# Patient Record
Sex: Male | Born: 1975 | Race: White | Hispanic: No | Marital: Married | Smoking: Current every day smoker
Health system: Southern US, Community
[De-identification: ages and names within clinical notes are randomized; demographics above are authoritative.]

## PROBLEM LIST (undated history)

## (undated) DIAGNOSIS — F329 Major depressive disorder, single episode, unspecified: Secondary | ICD-10-CM

## (undated) DIAGNOSIS — G4700 Insomnia, unspecified: Secondary | ICD-10-CM

## (undated) DIAGNOSIS — F419 Anxiety disorder, unspecified: Secondary | ICD-10-CM

## (undated) DIAGNOSIS — F410 Panic disorder [episodic paroxysmal anxiety] without agoraphobia: Secondary | ICD-10-CM

## (undated) DIAGNOSIS — F32A Depression, unspecified: Secondary | ICD-10-CM

## (undated) HISTORY — DX: Anxiety disorder, unspecified: F41.9

## (undated) HISTORY — PX: HAND SURGERY: SHX662

## (undated) HISTORY — DX: Panic disorder (episodic paroxysmal anxiety): F41.0

## (undated) HISTORY — DX: Insomnia, unspecified: G47.00

---

## 1999-08-21 ENCOUNTER — Observation Stay (HOSPITAL_COMMUNITY): Admission: RE | Admit: 1999-08-21 | Discharge: 1999-08-22 | Payer: Self-pay | Admitting: Orthopedic Surgery

## 1999-08-21 ENCOUNTER — Encounter: Payer: Self-pay | Admitting: Orthopedic Surgery

## 2003-05-25 ENCOUNTER — Emergency Department (HOSPITAL_COMMUNITY): Admission: EM | Admit: 2003-05-25 | Discharge: 2003-05-25 | Payer: Self-pay | Admitting: Emergency Medicine

## 2003-05-25 ENCOUNTER — Encounter: Payer: Self-pay | Admitting: Emergency Medicine

## 2003-09-16 ENCOUNTER — Emergency Department (HOSPITAL_COMMUNITY): Admission: EM | Admit: 2003-09-16 | Discharge: 2003-09-17 | Payer: Self-pay | Admitting: Emergency Medicine

## 2003-09-18 ENCOUNTER — Ambulatory Visit (HOSPITAL_COMMUNITY): Admission: RE | Admit: 2003-09-18 | Discharge: 2003-09-18 | Payer: Self-pay | Admitting: Family Medicine

## 2003-10-11 ENCOUNTER — Emergency Department (HOSPITAL_COMMUNITY): Admission: EM | Admit: 2003-10-11 | Discharge: 2003-10-11 | Payer: Self-pay | Admitting: Emergency Medicine

## 2004-01-05 ENCOUNTER — Emergency Department (HOSPITAL_COMMUNITY): Admission: EM | Admit: 2004-01-05 | Discharge: 2004-01-05 | Payer: Self-pay | Admitting: Emergency Medicine

## 2004-01-07 ENCOUNTER — Emergency Department (HOSPITAL_COMMUNITY): Admission: EM | Admit: 2004-01-07 | Discharge: 2004-01-08 | Payer: Self-pay | Admitting: Emergency Medicine

## 2004-01-10 ENCOUNTER — Ambulatory Visit (HOSPITAL_COMMUNITY): Admission: RE | Admit: 2004-01-10 | Discharge: 2004-01-10 | Payer: Self-pay | Admitting: General Surgery
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
## 2005-03-17 ENCOUNTER — Emergency Department (HOSPITAL_COMMUNITY): Admission: EM | Admit: 2005-03-17 | Discharge: 2005-03-18 | Payer: Self-pay | Admitting: Emergency Medicine

## 2005-09-16 ENCOUNTER — Emergency Department (HOSPITAL_COMMUNITY): Admission: EM | Admit: 2005-09-16 | Discharge: 2005-09-16 | Payer: Self-pay | Admitting: Emergency Medicine

## 2005-09-17 ENCOUNTER — Emergency Department (HOSPITAL_COMMUNITY): Admission: EM | Admit: 2005-09-17 | Discharge: 2005-09-18 | Payer: Self-pay | Admitting: *Deleted

## 2005-09-21 ENCOUNTER — Ambulatory Visit: Payer: Self-pay | Admitting: Orthopedic Surgery

## 2007-02-03 ENCOUNTER — Emergency Department (HOSPITAL_COMMUNITY): Admission: EM | Admit: 2007-02-03 | Discharge: 2007-02-04 | Payer: Self-pay | Admitting: Emergency Medicine

## 2007-04-22 ENCOUNTER — Inpatient Hospital Stay (HOSPITAL_COMMUNITY): Admission: AD | Admit: 2007-04-22 | Discharge: 2007-04-27 | Payer: Self-pay | Admitting: Psychiatry

## 2007-04-22 ENCOUNTER — Emergency Department (HOSPITAL_COMMUNITY): Admission: EM | Admit: 2007-04-22 | Discharge: 2007-04-22 | Payer: Self-pay | Admitting: Emergency Medicine

## 2007-04-22 ENCOUNTER — Ambulatory Visit: Payer: Self-pay | Admitting: Psychiatry

## 2007-05-19 ENCOUNTER — Ambulatory Visit (HOSPITAL_COMMUNITY): Payer: Self-pay | Admitting: Psychiatry

## 2009-03-29 ENCOUNTER — Ambulatory Visit (HOSPITAL_COMMUNITY): Admission: RE | Admit: 2009-03-29 | Discharge: 2009-03-29 | Payer: Self-pay | Admitting: Family Medicine

## 2009-06-11 ENCOUNTER — Encounter (INDEPENDENT_AMBULATORY_CARE_PROVIDER_SITE_OTHER): Payer: Self-pay | Admitting: *Deleted

## 2009-08-29 ENCOUNTER — Emergency Department (HOSPITAL_COMMUNITY): Admission: EM | Admit: 2009-08-29 | Discharge: 2009-08-29 | Payer: Self-pay | Admitting: Emergency Medicine

## 2010-02-10 ENCOUNTER — Emergency Department (HOSPITAL_COMMUNITY): Admission: EM | Admit: 2010-02-10 | Discharge: 2010-02-10 | Payer: Self-pay | Admitting: Emergency Medicine

## 2010-05-30 ENCOUNTER — Emergency Department (HOSPITAL_COMMUNITY): Admission: EM | Admit: 2010-05-30 | Discharge: 2010-05-31 | Payer: Self-pay | Admitting: Emergency Medicine

## 2010-08-01 ENCOUNTER — Encounter (HOSPITAL_COMMUNITY)
Admission: RE | Admit: 2010-08-01 | Discharge: 2010-08-31 | Payer: Self-pay | Source: Home / Self Care | Admitting: Orthopaedic Surgery

## 2010-11-15 ENCOUNTER — Encounter: Payer: Self-pay | Admitting: Family Medicine

## 2011-01-28 LAB — URINE MICROSCOPIC-ADD ON

## 2011-01-28 LAB — BASIC METABOLIC PANEL
CO2: 26 mEq/L (ref 19–32)
Calcium: 9.2 mg/dL (ref 8.4–10.5)
Creatinine, Ser: 0.85 mg/dL (ref 0.4–1.5)
GFR calc Af Amer: >60 mL/min (ref >60)
GFR calc non Af Amer: >60 mL/min (ref >60)

## 2011-01-28 LAB — URINALYSIS, ROUTINE W REFLEX MICROSCOPIC
Glucose, UA: NEGATIVE mg/dL
Specific Gravity, Urine: 1.025 (ref 1.005–1.030)

## 2011-01-28 LAB — HEPATIC FUNCTION PANEL
AST: 30 U/L (ref 0–37)
Albumin: 4.3 g/dL (ref 3.5–5.2)
Total Bilirubin: 0.2 mg/dL — ABNORMAL LOW (ref 0.3–1.2)

## 2011-01-28 LAB — DIFFERENTIAL
Basophils Absolute: 0 10*3/uL (ref 0.0–0.1)
Basophils Relative: 0 % (ref 0–1)
Monocytes Relative: 6 % (ref 3–12)
Neutro Abs: 5.2 10*3/uL (ref 1.7–7.7)
Neutrophils Relative %: 49 % (ref 43–77)

## 2011-01-28 LAB — CBC
MCHC: 34 g/dL (ref 30.0–36.0)
RBC: 4.87 MIL/uL (ref 4.22–5.81)
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
## 2011-06-14 ENCOUNTER — Emergency Department (HOSPITAL_COMMUNITY)
Admission: EM | Admit: 2011-06-14 | Discharge: 2011-06-14 | Disposition: A | Discharge status: Home / Self Care | Payer: Medicaid Other | Attending: Emergency Medicine | Admitting: Emergency Medicine

## 2011-06-14 ENCOUNTER — Encounter: Payer: Self-pay | Admitting: *Deleted

## 2011-06-14 DIAGNOSIS — F17200 Nicotine dependence, unspecified, uncomplicated: Secondary | ICD-10-CM | POA: Insufficient documentation

## 2011-06-14 DIAGNOSIS — L509 Urticaria, unspecified: Secondary | ICD-10-CM

## 2011-06-14 MED ORDER — DEXAMETHASONE SODIUM PHOSPHATE 10 MG/ML IJ SOLN
10.0000 mg | Freq: Once | INTRAMUSCULAR | Status: AC
  Administered 2011-06-14: 10 mg via INTRAMUSCULAR
  Filled 2011-06-14: qty 1

## 2011-06-14 MED ORDER — PREDNISONE 20 MG PO TABS: 60.0000 mg | ORAL_TABLET | Freq: Every day | ORAL | Status: AC

## 2011-06-14 MED ORDER — FAMOTIDINE 20 MG PO TABS
20.0000 mg | ORAL_TABLET | Freq: Once | ORAL | Status: AC
  Administered 2011-06-14: 20 mg via ORAL
  Filled 2011-06-14: qty 1

## 2011-06-14 MED ORDER — HYDROXYZINE HCL 25 MG PO TABS
50.0000 mg | ORAL_TABLET | Freq: Once | ORAL | Status: AC
  Administered 2011-06-14: 50 mg via ORAL
  Filled 2011-06-14: qty 2

## 2011-06-14 MED ORDER — DEXAMETHASONE SODIUM PHOSPHATE 10 MG/ML IJ SOLN: 10.0000 mg | Freq: Once | INTRAMUSCULAR | Status: DC

## 2011-06-14 MED ORDER — HYDROXYZINE HCL 50 MG PO TABS: 50.0000 mg | ORAL_TABLET | Freq: Four times a day (QID) | ORAL | Status: AC | PRN

## 2011-06-14 MED ORDER — FAMOTIDINE 20 MG PO TABS: 20.0000 mg | ORAL_TABLET | Freq: Two times a day (BID) | ORAL | Status: DC

## 2011-06-14 NOTE — ED Notes (Signed)
Pt states he ate at a restaurant on Friday and 20 min after finishing he began to itch with hives. Pt skin red over torso and upper arms.

## 2011-06-22 NOTE — ED Provider Notes (Signed)
History     CSN: [id number] Arrival date & time: 06/14/2011 11:30 AM  Chief Complaint  Patient presents with  . Urticaria   Patient is a 35 y.o. male presenting with urticaria. The history is provided by the patient.  Urticaria This is a new problem. The current episode started yesterday (He ate at a local restaurant then developed rash within 30 minutes.  He did not eat any new foods,  no new skin products,  soaps, etc.). The problem occurs constantly. The problem has been waxing and waning. Associated symptoms include a rash. Pertinent negatives include no abdominal pain, arthralgias, chest pain, congestion, coughing, diaphoresis, fever, headaches, joint swelling, nausea, neck pain, numbness, sore throat, swollen glands or weakness. Associated symptoms comments: He denies lip,  Facial or tongue swelling,  No sob. . The symptoms are aggravated by nothing. Treatments tried: benadryl. The treatment provided no relief.  Urticaria This is a new problem. The current episode started yesterday (He ate at a local restaurant then developed rash within 30 minutes.  He did not eat any new foods,  no new skin products,  soaps, etc.). The problem occurs constantly. The problem has been waxing and waning. Pertinent negatives include no chest pain, no abdominal pain, no headaches and no shortness of breath. Associated symptoms comments: He denies lip,  Facial or tongue swelling,  No sob. . The symptoms are aggravated by nothing. Treatments tried: benadryl. The treatment provided no relief.    History reviewed. No pertinent past medical history.  Past Surgical History  Procedure Date  . Hand surgery     History reviewed. No pertinent family history.  History  Substance Use Topics  . Smoking status: Current Everyday Smoker -- 1.0 packs/day  . Smokeless tobacco: Not on file  . Alcohol Use: No      Review of Systems  Constitutional: Negative for fever and diaphoresis.  HENT: Negative for  congestion, sore throat, facial swelling and neck pain.   Eyes: Negative.   Respiratory: Negative for cough, chest tightness and shortness of breath.   Cardiovascular: Negative for chest pain.  Gastrointestinal: Negative for nausea and abdominal pain.  Genitourinary: Negative.   Musculoskeletal: Negative for joint swelling and arthralgias.  Skin: Positive for rash. Negative for wound.  Neurological: Negative for dizziness, weakness, light-headedness, numbness and headaches.  Hematological: Negative.   Psychiatric/Behavioral: Negative.     Physical Exam  BP 120/73  Pulse 102  Temp(Src) 98.7 F (37.1 C) (Oral)  Resp 16  Ht 5\' 11"  (1.803 m)  Wt 201 lb (91.173 kg)  BMI 28.03 kg/m2  SpO2 98%  Physical Exam  Nursing note and vitals reviewed. Constitutional: He is oriented to person, place, and time. He appears well-developed and well-nourished.  HENT:  Head: Normocephalic and atraumatic.  Eyes: Conjunctivae are normal.  Neck: Normal range of motion.  Cardiovascular: Normal rate, regular rhythm, normal heart sounds and intact distal pulses.   Pulmonary/Chest: Effort normal and breath sounds normal. He has no wheezes.  Musculoskeletal: Normal range of motion.  Neurological: He is alert and oriented to person, place, and time.  Skin: Skin is warm and dry. Rash noted. Rash is urticarial.       Urticarial rash noted on chest,  Abdomen,  Upper extremities.  Psychiatric: He has a normal mood and affect.    ED Course  Procedures Patient given decadron 10 mg IM.  Prednisone,  famotidine and atarax for sx relief. MDM Urticarial rash of unclear etiology.  Candis Musa, PA 06/22/11 0116  Medical screening examination/treatment/procedure(s) were performed by non-physician practitioner and as supervising physician I was immediately available for consultation/collaboration.  Ethelda Chick, MD 07/14/11 [phone]

## 2011-08-12 LAB — CBC
HCT: 41.6
Hemoglobin: 14.5
Platelets: 233
WBC: 12.3 — ABNORMAL HIGH

## 2011-08-12 LAB — BASIC METABOLIC PANEL
BUN: 7
GFR calc non Af Amer: >60
Potassium: 3.8
Sodium: 138

## 2011-08-12 LAB — DIFFERENTIAL
Eosinophils Relative: 4
Lymphocytes Relative: 39
Lymphs Abs: 4.8 — ABNORMAL HIGH
Neutro Abs: 5.9

## 2011-08-12 LAB — ACETAMINOPHEN LEVEL: Acetaminophen (Tylenol), Serum: 12

## 2011-08-12 LAB — ETHANOL: Alcohol, Ethyl (B): <5

## 2012-01-23 ENCOUNTER — Encounter (HOSPITAL_COMMUNITY): Payer: Self-pay | Admitting: *Deleted

## 2012-01-23 ENCOUNTER — Emergency Department (HOSPITAL_COMMUNITY)
Admission: EM | Admit: 2012-01-23 | Discharge: 2012-01-24 | Disposition: A | Discharge status: Other Acute Inpatient Hospital | Payer: Medicaid Other | Attending: Emergency Medicine | Admitting: Emergency Medicine

## 2012-01-23 DIAGNOSIS — R45850 Homicidal ideations: Secondary | ICD-10-CM | POA: Insufficient documentation

## 2012-01-23 DIAGNOSIS — F3289 Other specified depressive episodes: Secondary | ICD-10-CM | POA: Insufficient documentation

## 2012-01-23 DIAGNOSIS — F329 Major depressive disorder, single episode, unspecified: Secondary | ICD-10-CM | POA: Insufficient documentation

## 2012-01-23 DIAGNOSIS — R45851 Suicidal ideations: Secondary | ICD-10-CM

## 2012-01-23 DIAGNOSIS — F1410 Cocaine abuse, uncomplicated: Secondary | ICD-10-CM | POA: Insufficient documentation

## 2012-01-23 DIAGNOSIS — F17200 Nicotine dependence, unspecified, uncomplicated: Secondary | ICD-10-CM | POA: Insufficient documentation

## 2012-01-23 HISTORY — DX: Depression, unspecified: F32.A

## 2012-01-23 HISTORY — DX: Major depressive disorder, single episode, unspecified: F32.9

## 2012-01-23 LAB — COMPREHENSIVE METABOLIC PANEL
Albumin: 4.2 g/dL (ref 3.5–5.2)
Alkaline Phosphatase: 108 U/L (ref 39–117)
BUN: 8 mg/dL (ref 6–23)
CO2: 28 mEq/L (ref 19–32)
Chloride: 100 mEq/L (ref 96–112)
Creatinine, Ser: 0.96 mg/dL (ref 0.50–1.35)
GFR calc Af Amer: >90 mL/min (ref >90)
GFR calc non Af Amer: >90 mL/min (ref >90)
Glucose, Bld: 103 mg/dL — ABNORMAL HIGH (ref 70–99)
Total Bilirubin: 0.9 mg/dL (ref 0.3–1.2)

## 2012-01-23 LAB — CBC
HCT: 44.8 % (ref 39.0–52.0)
Hemoglobin: 15.4 g/dL (ref 13.0–17.0)
MCV: 87.3 fL (ref 78.0–100.0)
RDW: 12.2 % (ref 11.5–15.5)
WBC: 9.7 10*3/uL (ref 4.0–10.5)

## 2012-01-23 LAB — ETHANOL: Alcohol, Ethyl (B): <11 mg/dL (ref 0–11)

## 2012-01-23 LAB — ACETAMINOPHEN LEVEL: Acetaminophen (Tylenol), Serum: <15 ug/mL (ref 10–30)

## 2012-01-23 LAB — RAPID URINE DRUG SCREEN, HOSP PERFORMED: Barbiturates: NOT DETECTED

## 2012-01-23 NOTE — ED Provider Notes (Signed)
History   This chart was scribed for Darrell Guppy, MD by Charolett Bumpers . The patient was seen in room APA16A/APA16A and the patient's care was started at 3:14pm.    CSN: [id number]  Arrival date & time 01/23/12  1436   First MD Initiated Contact with Patient 01/23/12 1511      Chief Complaint  Patient presents with  . V70.1    (Consider location/radiation/quality/duration/timing/severity/associated sxs/prior treatment) HPI [name] is a 36 y.o. male who presents to the Emergency Department for medical clearance with IVC papers taken out by the patient's wife. Per IVC papers, patient has been threatening others as well as himself for the past couple of days, and using crack-cocaine. Patient has a h/o depression in which he takes Prozac for. Per papers, patient has been off of his medication. Per patient's report, he denies suicidal and homicidal ideations. Patient denies any h/o psychiatric problems. Patient denies any recent drug use and states that the last time he used crack-cocaine was "a long time ago." Patient denies any alcohol use. Patient states that the only medications he takes are Prozac and Hydrocodone. Patient denies any recent illness. No other pertinent medical hx reported. No associated symptoms reported. Officer is at bedside.  Past Medical History  Diagnosis Date  . Depression     Past Surgical History  Procedure Date  . Hand surgery     History reviewed. No pertinent family history.  History  Substance Use Topics  . Smoking status: Current Everyday Smoker -- 1.0 packs/day    Types: Cigarettes  . Smokeless tobacco: Not on file  . Alcohol Use: Yes     occ. use      Review of Systems A complete 10 system review of systems was obtained and all systems are negative except as noted in the HPI and PMH.   Allergies  Cephalexin; Keflex; Levaquin; Penicillins; and Propoxacet-n  Home Medications   Current Outpatient Rx  Name Route Sig  Dispense Refill  . FLUOXETINE HCL 20 MG PO CAPS Oral Take 20 mg by mouth daily.    Marland Kitchen HYDROCODONE-ACETAMINOPHEN 7.5-325 MG PO TABS Oral Take 1 tablet by mouth every 4 (four) hours as needed. pain      BP 115/68  Pulse 98  Temp(Src) 97.8 F (36.6 C) (Oral)  Resp 18  Ht 5\' 10"  (1.778 m)  Wt 195 lb (88.451 kg)  BMI 27.98 kg/m2  SpO2 96%  Physical Exam  Nursing note and vitals reviewed. Constitutional: He is oriented to person, place, and time. He appears well-developed and well-nourished. No distress.  HENT:  Head: Normocephalic and atraumatic.  Right Ear: External ear normal.  Left Ear: External ear normal.  Nose: Nose normal.  Eyes: EOM are normal. Pupils are equal, round, and reactive to light.  Neck: Normal range of motion. Neck supple. No tracheal deviation present.  Cardiovascular: Normal rate, regular rhythm and normal heart sounds.  Exam reveals no gallop and no friction rub.   No murmur heard. Pulmonary/Chest: Effort normal and breath sounds normal. No respiratory distress. He has no wheezes. He has no rales.  Abdominal: Soft. Bowel sounds are normal. He exhibits no distension. There is no tenderness.  Musculoskeletal: Normal range of motion. He exhibits no edema.  Neurological: He is alert and oriented to person, place, and time. No sensory deficit.  Skin: Skin is warm and dry.  Psychiatric: He has a normal mood and affect. His behavior is normal. Judgment and thought content normal.  ED Course  Procedures (including critical care time) Alleged suicidal and homicidal thoughts.  The patient.  Denies suicidal or homicidal thoughts.  At this time.  He is not psychotic and he is not impaired.  He is reticent in his speech, and I'm not sure whether he is being truthful or not.  With the intent of being released to carry out.  His suicidal or homicidal plans.  The IVC papers completed by his wife state that.  He did complete a note stating that he wasn't contemplating suicide  and that his family would be better off without him  DIAGNOSTIC STUDIES: Oxygen Saturation is 96% on room air, adequate by my interpretation.    COORDINATION OF CARE:  1523: Discussed planned course of care with the patient who was agreeable at this time.  1639: Consult to call ACT team.  1747: Consultation with Behavioral Health who will assess the patient.    Results for orders placed during the hospital encounter of 01/23/12  CBC      Component Value Range   WBC 9.7  4.0 - 10.5 (K/uL)   RBC 5.13  4.22 - 5.81 (MIL/uL)   Hemoglobin 15.4  13.0 - 17.0 (g/dL)   HCT 11.9  14.7 - 82.9 (%)   MCV 87.3  78.0 - 100.0 (fL)   MCH 30.0  26.0 - 34.0 (pg)   MCHC 34.4  30.0 - 36.0 (g/dL)   RDW 56.2  13.0 - 86.5 (%)   Platelets 196  150 - 400 (K/uL)  COMPREHENSIVE METABOLIC PANEL      Component Value Range   Sodium 137  135 - 145 (mEq/L)   Potassium 4.0  3.5 - 5.1 (mEq/L)   Chloride 100  96 - 112 (mEq/L)   CO2 28  19 - 32 (mEq/L)   Glucose, Bld 103 (*) 70 - 99 (mg/dL)   BUN 8  6 - 23 (mg/dL)   Creatinine, Ser 7.84  0.50 - 1.35 (mg/dL)   Calcium 9.7  8.4 - 69.6 (mg/dL)   Total Protein 7.5  6.0 - 8.3 (g/dL)   Albumin 4.2  3.5 - 5.2 (g/dL)   AST 53 (*) 0 - 37 (U/L)   ALT 66 (*) 0 - 53 (U/L)   Alkaline Phosphatase 108  39 - 117 (U/L)   Total Bilirubin 0.9  0.3 - 1.2 (mg/dL)   GFR calc non Af Amer >90  >90 (mL/min)   GFR calc Af Amer >90  >90 (mL/min)  ETHANOL      Component Value Range   Alcohol, Ethyl (B) <11  0 - 11 (mg/dL)  ACETAMINOPHEN LEVEL      Component Value Range   Acetaminophen (Tylenol), Serum <15.0  10 - 30 (ug/mL)  URINE RAPID DRUG SCREEN (HOSP PERFORMED)      Component Value Range   Opiates NONE DETECTED  NONE DETECTED    Cocaine POSITIVE (*) NONE DETECTED    Benzodiazepines POSITIVE (*) NONE DETECTED    Amphetamines NONE DETECTED  NONE DETECTED    Tetrahydrocannabinol NONE DETECTED  NONE DETECTED    Barbiturates NONE DETECTED  NONE DETECTED      No diagnosis  found.    MDM  Alleged SI/HI with hx of substance abuse and depression    I personally performed the services described in this documentation, which was scribed in my presence. The recorded information has been reviewed and considered.      Darrell Guppy, MD 01/25/12 [phone]

## 2012-01-23 NOTE — ED Notes (Signed)
Gave patient crackers and peanut butter and ice water as requested.

## 2012-01-23 NOTE — ED Notes (Signed)
Pt states he doesn't want any visitors from his family to be allowed to visit.

## 2012-01-23 NOTE — ED Notes (Signed)
Act team at bedside 
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
## 2012-01-23 NOTE — ED Notes (Signed)
Patient lying in bed sleeping. Rise and fall of chest noted. Sitter at bedside.

## 2012-01-23 NOTE — ED Notes (Signed)
Pt brought here for involunatry commitment, denies any SI or HI, when asked pt smiled and denies HI, per papers pt using crack and threatening others and to self, denies any alcohol or drug use

## 2012-01-24 NOTE — ED Notes (Signed)
Called [facility] for transportation to [facility]

## 2012-01-24 NOTE — ED Notes (Signed)
Breakfast tray ordered 

## 2012-01-24 NOTE — ED Notes (Signed)
Pt sleeping, sitter in hall

## 2012-01-24 NOTE — ED Notes (Signed)
Amy Sandiford-[phone] please call upon pt transfer.

## 2012-01-24 NOTE — ED Notes (Signed)
Reported that pt's wife has pt's belongings

## 2012-01-24 NOTE — ED Notes (Signed)
Pt still sleeping...

## 2012-01-24 NOTE — ED Notes (Signed)
Breakfast tray given. °

## 2012-01-24 NOTE — BH Assessment (Signed)
Assessment Note   [name] is an 36 y.o. male. Patient was reassessed on this date. He continues to deny all of the previous issues discussed in prior assessment on 01/23/12; however, he is starting to make statements that he did think about what was discussed yesterday and he will use this opportunity to make some positive changes and change his life, hopefully for the better. He stated that his parents had all kinds of issues when he was younger, and related that back to him and his other siblings and the complications they have experienced. His children were here earlier-very nice children, well behaved. Discussed with him the importance of them seeing that he has to get better and to change so they do not have the same circumstances or issues as he and his siblings. He did agree that he really wanted to make things better, especially for them.  Talked with Dannielle Huh at [facility]; patient was accepted by Dr. Jeannine Kitten. [name] requested that we tell patient he would not receive narcotics while there. Patient verbalized his understanding; he hasn't requested or received any narcotics while here at [facility]; patient was agreeable. Support paperwork was completed; IVC paperwork updated and given to [facility] department.  Axis I: Substance Abuse-Cocaine Dependence; Major Depression, Recurrent Axis II: Deferred Axis II Hand Surgery Axis IV: Relationship issues, financial issues, job loss Axis V GAF 28  Past Medical History:  Past Medical History  Diagnosis Date  . Depression     Past Surgical History  Procedure Date  . Hand surgery     Family History: History reviewed. No pertinent family history.  Social History:  reports that he has been smoking Cigarettes.  He has been smoking about 1 pack per day. He does not have any smokeless tobacco history on file. He reports that he drinks alcohol. He reports that he does not use illicit drugs.  Additional Social History:      Allergies:  Allergies  Allergen Reactions  . Cephalexin   . Keflex Hives  . Levaquin Hives  . Penicillins Hives  . Propoxacet-N     Home Medications:  No current facility-administered medications on file as of 01/23/2012.   No current outpatient prescriptions on file as of 01/23/2012.    OB/GYN Status:  No LMP for male patient.  General Assessment Data Location of Assessment: [facility] ACT Assessment: Yes Living Arrangements: Family members Can pt return to current living arrangement?: Yes Admission Status: Involuntary Is patient capable of signing voluntary admission?: No Transfer from: Acute Hospital Referral Source: MD  Education Status Is patient currently in school?: No Contact person: Amy Hileman  Risk to self Suicidal Ideation: No (Denies currently; left a suicide note per IVC) Suicidal Intent: No-Not Currently/Within Last 6 Months Is patient at risk for suicide?: Yes Suicidal Plan?: Yes-Currently Present (per family; said he would burn trailer down and die) Specify Current Suicidal Plan: burn trailer down and kill himself  Access to Means: Yes Specify Access to Suicidal Means: fire What has been your use of drugs/alcohol within the last 12 months?: Denies-UDS is positive for cocaine and benzo's Previous Attempts/Gestures: No How many times?: 0  Other Self Harm Risks: unknown Triggers for Past Attempts: Unknown Intentional Self Injurious Behavior: None Family Suicide History: Unknown (grandfather was at [facility] for etoh and depression) Recent stressful life event(s): Conflict (Comment);Financial Problems;Job Loss (lost job over one year ago) Persecutory voices/beliefs?: No Depression: Yes Depression Symptoms: Isolating;Loss of interest in usual pleasures;Feeling angry/irritable  Substance abuse history and/or treatment for substance abuse?: Yes Suicide prevention information given to non-admitted patients: Not applicable  Risk to Others Homicidal Ideation: No-Not  Currently/Within Last 6 Months (pt denies, but family states he threatened kill father, brot) Thoughts of Harm to Others: No-Not Currently Present/Within Last 6 Months Current Homicidal Intent: No-Not Currently/Within Last 6 Months Current Homicidal Plan: No-Not Currently/Within Last 6 Months (unknown) Describe Current Homicidal Plan: unknow-was just a statemetn Access to Homicidal Means: No Identified Victim: father, step-brother History of harm to others?: No Assessment of Violence: None Noted Violent Behavior Description: none known Does patient have access to weapons?: Yes (Comment) (pawned father's gun) Criminal Charges Pending?: No Does patient have a court date: No  Psychosis Hallucinations: None noted Delusions: None noted  Mental Status Report Appear/Hygiene: Disheveled Eye Contact: Poor Motor Activity: Agitation;Restlessness;Rigidity Speech: Logical/coherent Level of Consciousness: Alert Mood: Anxious;Ambivalent Affect: Anxious;Blunted;Depressed Anxiety Level: Minimal Thought Processes: Relevant Judgement: Impaired Orientation: Person;Place;Time;Situation Obsessive Compulsive Thoughts/Behaviors: Minimal  Cognitive Functioning Concentration: Decreased Memory: Recent Intact;Remote Intact IQ: Average Insight: Poor Impulse Control: Poor Appetite: Fair Sleep:  (unknown) Vegetative Symptoms: None  Prior Inpatient Therapy Prior Inpatient Therapy: Yes Prior Therapy Dates: 2008 Prior Therapy Facilty/Provider(s): [facility] Reason for Treatment: substance abuse, depression  Prior Outpatient Therapy Prior Outpatient Therapy: No            Values / Beliefs Cultural Requests During Hospitalization: None Spiritual Requests During Hospitalization: None        Additional Information 1:1 In Past 12 Months?: No CIRT Risk: No Elopement Risk: Yes Does patient have medical clearance?: Yes     Disposition:  Disposition Disposition of Patient:  Inpatient treatment program Type of inpatient treatment program: Adult  On Site Evaluation by:  Dr. Deretha Emory Reviewed with Physician:  Dr. Deretha Emory  Patient accepted to [facility] by Dr. Jeannine Kitten; per Dannielle Huh. Transfer paperwork completed. Report transmitted through RN.   Shon Baton H 01/24/2012 11:24 AM

## 2012-01-24 NOTE — BH Assessment (Deleted)
Assessment Note   [name] is an 36 y.o. male. Patient was reassessed on this date. He continues to deny all of the previous issues discussed in prior assessment on 01/23/12; however, he is starting to make statements that he did think about what was discussed yesterday and he will use this opportunity to make some positive changes and change his life, hopefully for the better. He stated that his parents had all kinds of issues when he was younger, and related that back to him and his other siblings and the complications they have experienced. His children were here earlier-very nice children, well behaved. Discussed with him the importance of them seeing that he has to get better and to change so they do not have the same circumstances or issues as he and his siblings. He did agree that he really wanted to make things better, especially for them.  Talked with [name] at [facility]; patient was accepted by [name]. [name] requested that we tell patient he would not receive narcotics while there. Patient verbalized his understanding; he hasn't requested or received any narcotics while here at [facility]; patient was agreeable. Support paperwork was completed; IVC paperwork updated and given to [name] department.  Axis I: Substance Abuse-Cocaine Dependence; Major Depression, Recurrent Axis II: Deferred Axis II Hand Surgery Axis IV: Relationship issues, financial issues, job loss Axis V GAF 28  Past Medical History:  Past Medical History  Diagnosis Date  . Depression     Past Surgical History  Procedure Date  . Hand surgery     Family History: History reviewed. No pertinent family history.  Social History:  reports that he has been smoking Cigarettes.  He has been smoking about 1 pack per day. He does not have any smokeless tobacco history on file. He reports that he drinks alcohol. He reports that he does not use illicit drugs.  Additional Social History:      Allergies:  Allergies  Allergen Reactions  . Cephalexin   . Keflex Hives  . Levaquin Hives  . Penicillins Hives  . Propoxacet-N     Home Medications:  No current facility-administered medications on file as of 01/23/2012.   No current outpatient prescriptions on file as of 01/23/2012.    OB/GYN Status:  No LMP for male patient.  General Assessment Data Location of Assessment: [facility] ACT Assessment: Yes Living Arrangements: Family members Can pt return to current living arrangement?: Yes Admission Status: Involuntary Is patient capable of signing voluntary admission?: No Transfer from: Acute Hospital Referral Source: MD  Education Status Is patient currently in school?: No Contact person: [name]  Risk to self Suicidal Ideation: No (Denies currently; left a suicide note per IVC) Suicidal Intent: No-Not Currently/Within Last 6 Months Is patient at risk for suicide?: Yes Suicidal Plan?: Yes-Currently Present (per family; said he would burn trailer down and die) Specify Current Suicidal Plan: burn trailer down and kill himself  Access to Means: Yes Specify Access to Suicidal Means: fire What has been your use of drugs/alcohol within the last 12 months?: Denies-UDS is positive for cocaine and benzo's Previous Attempts/Gestures: No How many times?: 0  Other Self Harm Risks: unknown Triggers for Past Attempts: Unknown Intentional Self Injurious Behavior: None Family Suicide History: Unknown (grandfather was at [facility] for etoh and depression) Recent stressful life event(s): Conflict (Comment);Financial Problems;Job Loss (lost job over one year ago) Persecutory voices/beliefs?: No Depression: Yes Depression Symptoms: Isolating;Loss of interest in usual pleasures;Feeling angry/irritable  Substance abuse history and/or treatment for substance abuse?: Yes Suicide prevention information given to non-admitted patients: Not applicable  Risk to Others Homicidal Ideation: No-Not  Currently/Within Last 6 Months (pt denies, but family states he threatened kill father, brot) Thoughts of Harm to Others: No-Not Currently Present/Within Last 6 Months Current Homicidal Intent: No-Not Currently/Within Last 6 Months Current Homicidal Plan: No-Not Currently/Within Last 6 Months (unknown) Describe Current Homicidal Plan: unknow-was just a statemetn Access to Homicidal Means: No Identified Victim: father, step-brother History of harm to others?: No Assessment of Violence: None Noted Violent Behavior Description: none known Does patient have access to weapons?: Yes (Comment) (pawned father's gun) Criminal Charges Pending?: No Does patient have a court date: No  Psychosis Hallucinations: None noted Delusions: None noted  Mental Status Report Appear/Hygiene: Disheveled Eye Contact: Poor Motor Activity: Agitation;Restlessness;Rigidity Speech: Logical/coherent Level of Consciousness: Alert Mood: Anxious;Ambivalent Affect: Anxious;Blunted;Depressed Anxiety Level: Minimal Thought Processes: Relevant Judgement: Impaired Orientation: Person;Place;Time;Situation Obsessive Compulsive Thoughts/Behaviors: Minimal  Cognitive Functioning Concentration: Decreased Memory: Recent Intact;Remote Intact IQ: Average Insight: Poor Impulse Control: Poor Appetite: Fair Sleep:  (unknown) Vegetative Symptoms: None  Prior Inpatient Therapy Prior Inpatient Therapy: Yes Prior Therapy Dates: 2008 Prior Therapy Facilty/Provider(s): [facility] Reason for Treatment: substance abuse, depression  Prior Outpatient Therapy Prior Outpatient Therapy: No            Values / Beliefs Cultural Requests During Hospitalization: None Spiritual Requests During Hospitalization: None        Additional Information 1:1 In Past 12 Months?: No CIRT Risk: No Elopement Risk: Yes Does patient have medical clearance?: Yes     Disposition:  Disposition Disposition of Patient:  Inpatient treatment program Type of inpatient treatment program: Adult  On Site Evaluation by:  Dr. Deretha Emory Reviewed with Physician:  Dr. Deretha Emory  Patient accepted to [facility] by [name]; per [name]. Transfer paperwork completed. Report transmitted through RN.   Shon Baton H 01/24/2012 11:36 AM

## 2012-01-24 NOTE — ED Notes (Signed)
Pt sleeping comfortably with equal chest rise and fall.  nad noted.  Sitter at bedside.

## 2012-01-24 NOTE — ED Notes (Signed)
Pt has been accepted at behavioral health but has no bed assignment. Behavioral health will call when ready for pt.

## 2014-04-24 ENCOUNTER — Other Ambulatory Visit (HOSPITAL_COMMUNITY): Payer: Self-pay | Admitting: Orthopaedic Surgery

## 2014-04-24 DIAGNOSIS — M25511 Pain in right shoulder: Secondary | ICD-10-CM

## 2014-04-30 ENCOUNTER — Other Ambulatory Visit (HOSPITAL_COMMUNITY): Payer: Self-pay | Admitting: Orthopaedic Surgery

## 2014-04-30 ENCOUNTER — Ambulatory Visit (HOSPITAL_COMMUNITY)
Admission: RE | Admit: 2014-04-30 | Discharge: 2014-04-30 | Disposition: A | Discharge status: Home / Self Care | Payer: BC Managed Care – PPO | Source: Ambulatory Visit | Attending: Orthopaedic Surgery | Admitting: Orthopaedic Surgery

## 2014-04-30 DIAGNOSIS — M25511 Pain in right shoulder: Secondary | ICD-10-CM

## 2014-04-30 DIAGNOSIS — M719 Bursopathy, unspecified: Secondary | ICD-10-CM | POA: Insufficient documentation

## 2014-04-30 DIAGNOSIS — M25512 Pain in left shoulder: Secondary | ICD-10-CM

## 2014-04-30 DIAGNOSIS — M67919 Unspecified disorder of synovium and tendon, unspecified shoulder: Secondary | ICD-10-CM | POA: Insufficient documentation

## 2014-04-30 DIAGNOSIS — M25519 Pain in unspecified shoulder: Secondary | ICD-10-CM | POA: Insufficient documentation

## 2014-05-31 ENCOUNTER — Encounter: Payer: Self-pay | Admitting: Family Medicine

## 2014-05-31 ENCOUNTER — Ambulatory Visit (INDEPENDENT_AMBULATORY_CARE_PROVIDER_SITE_OTHER): Payer: BC Managed Care – PPO | Admitting: Family Medicine

## 2014-05-31 VITALS — BP 132/78 | Ht 71.0 in | Wt 189.0 lb

## 2014-05-31 DIAGNOSIS — F329 Major depressive disorder, single episode, unspecified: Secondary | ICD-10-CM

## 2014-05-31 DIAGNOSIS — F32A Depression, unspecified: Secondary | ICD-10-CM

## 2014-05-31 DIAGNOSIS — F3289 Other specified depressive episodes: Secondary | ICD-10-CM

## 2014-05-31 DIAGNOSIS — G4700 Insomnia, unspecified: Secondary | ICD-10-CM

## 2014-05-31 MED ORDER — FLUOXETINE HCL 40 MG PO CAPS: 40.0000 mg | ORAL_CAPSULE | Freq: Every day | ORAL | Status: DC

## 2014-05-31 NOTE — Progress Notes (Signed)
   Subjective:    Patient ID: [name], male    DOB: 07-14-1976, 38 y.o.   MRN: [id number]  HPIMed check up. Pt taking prozac 20mg . Would like to discuss increasing to 40mg .  Patient access to 40 mg Prozac. He took this for while. Reports considerable improvement in his mood.  Ongoing challenges with insomnia. This improves when he is on the medication.  Exercising some at work.  No suicidal or homicidal thoughts. Periods of feeling down on current 20 mg dose.   Works hard  Review of Systems No headache no chest pain no back pain no abdominal pain ROS otherwise negative    Objective:   Physical Exam  Alert no apparent distress. HEENT normal. Lungs clear. Heart regular in rhythm. Ankles without edema.     Assessment & Plan:  Impression 1 insomnia ongoing improvement with good treatment #2 #2 depression plan diet exercise discussed. Increase Prozac to 40. Check every 6 months. Warning signs discussed. WSL

## 2014-06-03 DIAGNOSIS — F329 Major depressive disorder, single episode, unspecified: Secondary | ICD-10-CM | POA: Insufficient documentation

## 2014-06-03 DIAGNOSIS — F32A Depression, unspecified: Secondary | ICD-10-CM | POA: Insufficient documentation

## 2014-06-03 DIAGNOSIS — G4700 Insomnia, unspecified: Secondary | ICD-10-CM | POA: Insufficient documentation

## 2014-07-09 ENCOUNTER — Ambulatory Visit (INDEPENDENT_AMBULATORY_CARE_PROVIDER_SITE_OTHER): Payer: BC Managed Care – PPO | Admitting: Family Medicine

## 2014-07-09 ENCOUNTER — Encounter: Payer: Self-pay | Admitting: Family Medicine

## 2014-07-09 VITALS — BP 130/78 | Ht 71.0 in | Wt 197.0 lb

## 2014-07-09 DIAGNOSIS — Z23 Encounter for immunization: Secondary | ICD-10-CM

## 2014-07-09 DIAGNOSIS — Z79899 Other long term (current) drug therapy: Secondary | ICD-10-CM

## 2014-07-09 DIAGNOSIS — Z0000 Encounter for general adult medical examination without abnormal findings: Secondary | ICD-10-CM

## 2014-07-09 MED ORDER — FLUOXETINE HCL 20 MG PO TABS: 20.0000 mg | ORAL_TABLET | Freq: Every day | ORAL | Status: DC

## 2014-07-09 NOTE — Progress Notes (Signed)
   Subjective:    Patient ID: [name], male    DOB: 1975/12/01, 38 y.o.   MRN: [id number]  HPI Patient states he doesn't need any medication refills.   Patient isn't doing any diet or exercise. Patient has no concerns.  Forty mg on the prozac has overcone itnn  Job is physcically active  Gets a lot of exercise at work  Diet overall pretty good  Tries to eat health  No flu shot, will do flu shot today  Smoking less than a pack, started young at [age]  Breathing ok  Rare alcohol intake   Review of Systems  Constitutional: Negative for fever, activity change and appetite change.  HENT: Negative for congestion and rhinorrhea.   Eyes: Negative for discharge.  Respiratory: Negative for cough and wheezing.   Cardiovascular: Negative for chest pain.  Gastrointestinal: Negative for vomiting, abdominal pain and blood in stool.  Genitourinary: Negative for frequency and difficulty urinating.  Musculoskeletal: Negative for neck pain.  Skin: Negative for rash.  Allergic/Immunologic: Negative for environmental allergies and food allergies.  Neurological: Negative for weakness and headaches.  Psychiatric/Behavioral: Negative for agitation.  All other systems reviewed and are negative.      Objective:   Physical Exam  Vitals reviewed. Constitutional: He appears well-developed and well-nourished.  HENT:  Head: Normocephalic and atraumatic.  Right Ear: External ear normal.  Left Ear: External ear normal.  Nose: Nose normal.  Mouth/Throat: Oropharynx is clear and moist.  Eyes: EOM are normal. Pupils are equal, round, and reactive to light.  Neck: Normal range of motion. Neck supple. No thyromegaly present.  Cardiovascular: Normal rate, regular rhythm and normal heart sounds.   No murmur heard. Pulmonary/Chest: Effort normal and breath sounds normal. No respiratory distress. He has no wheezes.  Abdominal: Soft. Bowel sounds are normal. He exhibits no distension and no  mass. There is no tenderness.  Genitourinary: Penis normal.  Musculoskeletal: Normal range of motion. He exhibits no edema.  Lymphadenopathy:    He has no cervical adenopathy.  Neurological: He is alert. He exhibits normal muscle tone.  Skin: Skin is warm and dry. No erythema.  Psychiatric: He has a normal mood and affect. His behavior is normal. Judgment normal.          Assessment & Plan:  Impression 1 this wellness exam #2 chronic smoker discussed #3 depression increased dose of Prozac is worse in the patient's mood. Plan decrease Prozac back to 20. Exercise diet discussed. Appropriate blood work. Quit smoking. WSL

## 2014-07-09 NOTE — Progress Notes (Signed)
   Subjective:    Patient ID: [name], male    DOB: [birth year], 38 y.o.   MRN: [id number]  HPI The patient comes in today for a wellness visit.    A review of their health history was completed.  A review of medications was also comple Review of Systems     Objective:   Physical Exam        Assessment & Plan:

## 2014-07-14 ENCOUNTER — Encounter: Payer: Self-pay | Admitting: *Deleted

## 2017-01-30 ENCOUNTER — Encounter (HOSPITAL_COMMUNITY): Payer: Self-pay | Admitting: *Deleted

## 2017-01-30 ENCOUNTER — Emergency Department (HOSPITAL_COMMUNITY): Payer: 59

## 2017-01-30 ENCOUNTER — Emergency Department (HOSPITAL_COMMUNITY)
Admission: EM | Admit: 2017-01-30 | Discharge: 2017-01-31 | Disposition: A | Discharge status: Home / Self Care | Payer: 59 | Attending: Emergency Medicine | Admitting: Emergency Medicine

## 2017-01-30 DIAGNOSIS — F17210 Nicotine dependence, cigarettes, uncomplicated: Secondary | ICD-10-CM | POA: Insufficient documentation

## 2017-01-30 DIAGNOSIS — K029 Dental caries, unspecified: Secondary | ICD-10-CM | POA: Insufficient documentation

## 2017-01-30 DIAGNOSIS — K047 Periapical abscess without sinus: Secondary | ICD-10-CM | POA: Insufficient documentation

## 2017-01-30 DIAGNOSIS — R220 Localized swelling, mass and lump, head: Secondary | ICD-10-CM

## 2017-01-30 DIAGNOSIS — K0889 Other specified disorders of teeth and supporting structures: Secondary | ICD-10-CM | POA: Diagnosis present

## 2017-01-30 LAB — CBC WITH DIFFERENTIAL/PLATELET
Basophils Absolute: 0.1 10*3/uL (ref 0.0–0.1)
Basophils Relative: 0 %
Eosinophils Absolute: 0.5 10*3/uL (ref 0.0–0.7)
Eosinophils Relative: 3 %
HCT: 44.9 % (ref 39.0–52.0)
HEMOGLOBIN: 15.8 g/dL (ref 13.0–17.0)
LYMPHS ABS: 3.2 10*3/uL (ref 0.7–4.0)
Lymphocytes Relative: 17 %
MCH: 30.7 pg (ref 26.0–34.0)
MCHC: 35.2 g/dL (ref 30.0–36.0)
MCV: 87.4 fL (ref 78.0–100.0)
MONO ABS: 1.7 10*3/uL — AB (ref 0.1–1.0)
MONOS PCT: 9 %
Neutro Abs: 12.8 10*3/uL — ABNORMAL HIGH (ref 1.7–7.7)
Neutrophils Relative %: 71 %
PLATELETS: 258 10*3/uL (ref 150–400)
RBC: 5.14 MIL/uL (ref 4.22–5.81)
RDW: 11.9 % (ref 11.5–15.5)
WBC: 18.2 10*3/uL — ABNORMAL HIGH (ref 4.0–10.5)

## 2017-01-30 LAB — BASIC METABOLIC PANEL
ANION GAP: 7 (ref 5–15)
BUN: 6 mg/dL (ref 6–20)
CALCIUM: 9.5 mg/dL (ref 8.9–10.3)
CO2: 26 mmol/L (ref 22–32)
Chloride: 106 mmol/L (ref 101–111)
Creatinine, Ser: 0.88 mg/dL (ref 0.61–1.24)
GFR calc Af Amer: >60 mL/min (ref >60)
GFR calc non Af Amer: >60 mL/min (ref >60)
Glucose, Bld: 114 mg/dL — ABNORMAL HIGH (ref 65–99)
Potassium: 3.6 mmol/L (ref 3.5–5.1)
Sodium: 139 mmol/L (ref 135–145)

## 2017-01-30 MED ORDER — ONDANSETRON HCL 4 MG/2ML IJ SOLN
4.0000 mg | Freq: Once | INTRAMUSCULAR | Status: AC
  Administered 2017-01-30: 4 mg via INTRAVENOUS
  Filled 2017-01-30: qty 2

## 2017-01-30 MED ORDER — CLINDAMYCIN PHOSPHATE 900 MG/50ML IV SOLN
900.0000 mg | Freq: Once | INTRAVENOUS | Status: AC
  Administered 2017-01-30: 900 mg via INTRAVENOUS
  Filled 2017-01-30: qty 50

## 2017-01-30 MED ORDER — MELOXICAM 15 MG PO TABS: 15.0000 mg | ORAL_TABLET | Freq: Every day | ORAL | 0 refills | Status: DC

## 2017-01-30 MED ORDER — HYDROCODONE-ACETAMINOPHEN 5-325 MG PO TABS: 1.0000 | ORAL_TABLET | ORAL | 0 refills | Status: DC | PRN

## 2017-01-30 MED ORDER — MORPHINE SULFATE (PF) 4 MG/ML IV SOLN
4.0000 mg | Freq: Once | INTRAVENOUS | Status: AC
  Administered 2017-01-30: 4 mg via INTRAVENOUS
  Filled 2017-01-30: qty 1

## 2017-01-30 MED ORDER — IOPAMIDOL (ISOVUE-300) INJECTION 61%
75.0000 mL | Freq: Once | INTRAVENOUS | Status: AC | PRN
  Administered 2017-01-30: 75 mL via INTRAVENOUS

## 2017-01-30 MED ORDER — CLINDAMYCIN HCL 150 MG PO CAPS: ORAL_CAPSULE | ORAL | 0 refills | Status: DC

## 2017-01-30 NOTE — ED Notes (Signed)
Patient transported to CT 

## 2017-01-30 NOTE — ED Notes (Signed)
Patient transported to X-ray 

## 2017-01-30 NOTE — ED Provider Notes (Signed)
[facility] Provider Note   CSN: [id number] Arrival date & time: 01/30/17  2008     History   Chief Complaint Chief Complaint  Patient presents with  . Facial Swelling    HPI [name] is a 41 y.o. male.  Patient is a 41 year old male who presents to the emergency department with a complaint of toothache and right-sided jaw pain. The patient states he's had some swelling and discomfort on the left cheek for about 2 weeks. He has had a broken tooth on the right side for quite some time. He now has swelling of his right jaw, he has ringing in the right ear, and he has a discomfort involving his right neck. He states that at times it feels like it's difficult for him to swallow. He has no problems with his breathing. He is not had any previous operations or procedures involving his face, his jaw, or his throat. He has had a few chills, but has not measured her temperature. He presents now for additional evaluation concerning this issue.   The history is provided by the patient.    Past Medical History:  Diagnosis Date  . Anxiety   . Depression   . Insomnia   . Panic attack     Patient Active Problem List   Diagnosis Date Noted  . Depression 06/03/2014  . Insomnia 06/03/2014    Past Surgical History:  Procedure Laterality Date  . HAND SURGERY Right        Home Medications    Prior to Admission medications   Medication Sig Start Date End Date Taking? Authorizing Provider  FLUoxetine (PROZAC) 20 MG tablet Take 1 tablet (20 mg total) by mouth daily. 07/09/14   Merlyn Albert, MD  HYDROcodone-acetaminophen (NORCO) 7.5-325 MG per tablet Take 1 tablet by mouth every 4 (four) hours as needed. pain    Historical Provider, MD  naproxen (NAPROSYN) 500 MG tablet  05/17/14   Historical Provider, MD    Family History History reviewed. No pertinent family history.  Social History Social History  Substance Use Topics  . Smoking status: Current Every Day Smoker   Packs/day: 1.00    Types: Cigarettes  . Smokeless tobacco: Never Used  . Alcohol use Yes     Comment: occ. use     Allergies   Cephalexin; Cephalexin; Levofloxacin; Penicillins; and Propoxyphene n-acetaminophen   Review of Systems Review of Systems  Constitutional: Positive for appetite change and chills.  HENT: Positive for dental problem.   Eyes: Negative for photophobia, pain, redness and visual disturbance.  Respiratory: Negative for cough, choking, chest tightness, shortness of breath, wheezing and stridor.   Allergic/Immunologic: Negative for immunocompromised state.  All other systems reviewed and are negative.    Physical Exam Updated Vital Signs BP (!) 148/78 (BP Location: Right Arm)   Pulse 61   Temp 99.3 F (37.4 C) (Oral)   Resp 20   Ht  (1.803 m)   Wt 88.5 kg   SpO2 97%   BMI 27.20 kg/m   Physical Exam  Constitutional: He is oriented to person, place, and time. He appears well-developed and well-nourished.  Non-toxic appearance.  HENT:  Head: Normocephalic.  Right Ear: Tympanic membrane and external ear normal.  Left Ear: Tympanic membrane and external ear normal.  There are multiple dental caries on the right and the left. There is swelling of the right lower jaw at the first molar, and also at the right lower premolar area.  There  is swelling of the right lower face. There is tenderness of the mandible up to the TMJ area. There is tenderness at the submandibular area, extending to the upper portion of the right neck.  Eyes: EOM and lids are normal. Pupils are equal, round, and reactive to light.  Neck: Normal range of motion. Neck supple. Carotid bruit is not present.  Cardiovascular: Normal rate, regular rhythm, normal heart sounds, intact distal pulses and normal pulses.  Exam reveals no gallop and no friction rub.   No murmur heard. Pulmonary/Chest: Breath sounds normal. No respiratory distress.  Abdominal: Soft. Bowel sounds are normal. There  is no tenderness. There is no guarding.  Musculoskeletal: Normal range of motion.  Lymphadenopathy:       Head (right side): No submandibular adenopathy present.       Head (left side): No submandibular adenopathy present.    He has no cervical adenopathy.  Neurological: He is alert and oriented to person, place, and time. He has normal strength. No cranial nerve deficit or sensory deficit.  Skin: Skin is warm and dry.  Psychiatric: He has a normal mood and affect. His speech is normal.  Nursing note and vitals reviewed.    ED Treatments / Results  Labs (all labs ordered are listed, but only abnormal results are displayed) Labs Reviewed  CBC WITH DIFFERENTIAL/PLATELET - Abnormal; Notable for the following:       Result Value   WBC 18.2 (*)    Neutro Abs 12.8 (*)    Monocytes Absolute 1.7 (*)    All other components within normal limits  BASIC METABOLIC PANEL - Abnormal; Notable for the following:    Glucose, Bld 114 (*)    All other components within normal limits    EKG  EKG Interpretation None       Radiology Ct Maxillofacial W Contrast  Result Date: 01/30/2017 CLINICAL DATA:  41 y/o M; right lower jaw abscess. Pressure and pain in the right year. EXAM: CT MAXILLOFACIAL WITH CONTRAST TECHNIQUE: Multidetector CT imaging of the maxillofacial structures was performed. Multiplanar CT image reconstructions were also generated. A small metallic BB was placed on the right temple in order to reliably differentiate right from left. COMPARISON:  08/29/2009 CT head CONTRAST:  75 cc Isovue-300 FINDINGS: Osseous: Right mandibular premolar periapical cysts with cortical defect in the outer table of the mandible (series 3, image 74). Additionally there are several dental caries of the maxillary and mandibular teeth and periapical cystic disease of the right first mandibular premolar which is absent the crown. Orbits: Negative. No traumatic or inflammatory finding. Sinuses: Multiple small  mucous retention cysts throughout the maxillary sinuses and right sphenoid sinus. Otherwise visualized paranasal sinuses and mastoid air cells are essentially normally aerated. Soft tissues: Dental abscess associated with the right mandibular premolar periapical disease measuring 32 x 12 x 22 mm (AP x ML x CC series 2: Image 73 and series 4: Image 42). Extensive surrounding soft tissue swelling overlying the right aspect of the mandible, extending into the right cheek subcutaneous fat, and into the right submandibular space. No extension of inflammatory changes are identified in the deep cervical spaces.) Limited intracranial: No significant or unexpected finding. IMPRESSION: Dental abscess superficial to the right mandibular ramus associated with right mandibular premolar periapical disease measuring up to 3.2 cm with extensive surrounding superficial soft tissue inflammatory changes. No extension of inflammatory changes into the deep cervical spaces. Electronically Signed   By: Mitzi Hansen M.D.   On: 01/30/2017  23:39    Procedures Procedures (including critical care time)  Medications Ordered in ED Medications  clindamycin (CLEOCIN) IVPB 900 mg (900 mg Intravenous New Bag/Given 01/30/17 2335)  morphine 4 MG/ML injection 4 mg (4 mg Intravenous Given 01/30/17 2139)  ondansetron (ZOFRAN) injection 4 mg (4 mg Intravenous Given 01/30/17 2138)  iopamidol (ISOVUE-300) 61 % injection 75 mL (75 mLs Intravenous Contrast Given 01/30/17 2301)     Initial Impression / Assessment and Plan / ED Course  I have reviewed the triage vital signs and the nursing notes.  Pertinent labs & imaging results that were available during my care of the patient were reviewed by me and considered in my medical decision making (see chart for details).     **I have reviewed nursing notes, vital signs, and all appropriate lab and imaging results for this patient.*  Final Clinical Impressions(s) / ED  Diagnoses MDM Vital signs monitored closely. Pulse oximetry is 95% on room air. Patient speaks in complete sentences without problem.  Complete blood count shows the white blood cells to be elevated at 18,200 on. The metabolic panel is within normal limits. CT scan of the maxillofacial area shows a dental abscess superficial to the right mandibular ramus associated with right mandibular premolar periapical disease. There is extensive superficial soft tissue inflammation noted. There no inflammatory changes in the deep cervical spaces. There is some inflammation/infection extending in the right cheek subcutaneous fat.  Patient started on intravenous clindamycin. Patient treated for pain. Prescription for clindamycin and Norco given to the patient as well as meloxicam.  I discussed the findings with the patient in detail. I explained to him the importance of seeing a oral surgeon as soon as possible. We discussed the possible dangers of not being evaluated and treated by the dental specialist. The patient acknowledges understanding of these instructions. He is in agreement with this plan.    Final diagnoses:  Dental abscess  Right facial swelling  Dental caries    New Prescriptions New Prescriptions   No medications on file     Ivery Quale, PA-C 01/31/17 1637    Bethann Berkshire, MD 01/31/17 2312

## 2017-01-30 NOTE — ED Triage Notes (Addendum)
Pt reports having a tooth break off on the right lower side of his jaw x 2 weeks and swelling to his right cheek and neck since this morning. Pt reports pressure in his head with ringing in both ears, right ear throbbing, right sided neck pain. Pt states he feels like "something" is draining down into his chest. Pt reports not eating or drinking or eating today due to feeling like he is having "pressure" in his throat and difficulty opening his mouth.

## 2017-01-31 NOTE — Discharge Instructions (Signed)
You have multiple abscess areas of the right jaw. You have soft tissue infection extending into your right face. Your white blood cell count is elevated. It is extremely important that you see an oral surgeon as sone as possible. Please use clindamycin 2 times daily with food. Use meloxicam daily with food. May use Norco for severe pain if needed. This medication may cause drowsiness, please use with caution.

## 2018-03-13 ENCOUNTER — Emergency Department (HOSPITAL_COMMUNITY)
Admission: EM | Admit: 2018-03-13 | Discharge: 2018-03-13 | Disposition: A | Discharge status: Home / Self Care | Payer: No Typology Code available for payment source | Attending: Emergency Medicine | Admitting: Emergency Medicine

## 2018-03-13 ENCOUNTER — Encounter (HOSPITAL_COMMUNITY): Payer: Self-pay | Admitting: Emergency Medicine

## 2018-03-13 ENCOUNTER — Emergency Department (HOSPITAL_COMMUNITY): Payer: No Typology Code available for payment source

## 2018-03-13 DIAGNOSIS — Y929 Unspecified place or not applicable: Secondary | ICD-10-CM | POA: Insufficient documentation

## 2018-03-13 DIAGNOSIS — S161XXA Strain of muscle, fascia and tendon at neck level, initial encounter: Secondary | ICD-10-CM | POA: Insufficient documentation

## 2018-03-13 DIAGNOSIS — S60212A Contusion of left wrist, initial encounter: Secondary | ICD-10-CM | POA: Insufficient documentation

## 2018-03-13 DIAGNOSIS — Y9389 Activity, other specified: Secondary | ICD-10-CM | POA: Insufficient documentation

## 2018-03-13 DIAGNOSIS — Y999 Unspecified external cause status: Secondary | ICD-10-CM | POA: Insufficient documentation

## 2018-03-13 DIAGNOSIS — F17210 Nicotine dependence, cigarettes, uncomplicated: Secondary | ICD-10-CM | POA: Insufficient documentation

## 2018-03-13 MED ORDER — IBUPROFEN 800 MG PO TABS
800.0000 mg | ORAL_TABLET | Freq: Once | ORAL | Status: AC
  Administered 2018-03-13: 800 mg via ORAL
  Filled 2018-03-13: qty 1

## 2018-03-13 MED ORDER — TRAMADOL HCL 50 MG PO TABS: 50.0000 mg | ORAL_TABLET | Freq: Four times a day (QID) | ORAL | 0 refills | Status: DC | PRN

## 2018-03-13 MED ORDER — IBUPROFEN 600 MG PO TABS: 600.0000 mg | ORAL_TABLET | Freq: Four times a day (QID) | ORAL | 0 refills | Status: DC | PRN
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
## 2018-03-13 NOTE — Discharge Instructions (Addendum)
Your x-rays are negative tonight for any kind of bony injuries or dislocation.  You may wear the Velcro wrist splint which may provide pain relief and compression.  Ice and elevation will also help heal your injury.
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
## 2018-10-13 ENCOUNTER — Encounter (HOSPITAL_COMMUNITY): Payer: Self-pay | Admitting: Emergency Medicine

## 2018-10-13 ENCOUNTER — Other Ambulatory Visit: Payer: Self-pay

## 2018-10-13 ENCOUNTER — Emergency Department (HOSPITAL_COMMUNITY)
Admission: EM | Admit: 2018-10-13 | Discharge: 2018-10-13 | Disposition: A | Discharge status: Home / Self Care | Payer: Self-pay | Attending: Emergency Medicine | Admitting: Emergency Medicine

## 2018-10-13 DIAGNOSIS — K047 Periapical abscess without sinus: Secondary | ICD-10-CM | POA: Insufficient documentation

## 2018-10-13 DIAGNOSIS — F17210 Nicotine dependence, cigarettes, uncomplicated: Secondary | ICD-10-CM | POA: Insufficient documentation

## 2018-10-13 MED ORDER — HYDROCODONE-ACETAMINOPHEN 5-325 MG PO TABS: 1.0000 | ORAL_TABLET | Freq: Four times a day (QID) | ORAL | 0 refills | Status: AC | PRN

## 2018-10-13 MED ORDER — CLINDAMYCIN HCL 150 MG PO CAPS: 300.0000 mg | ORAL_CAPSULE | Freq: Three times a day (TID) | ORAL | 0 refills | Status: AC

## 2018-10-13 MED ORDER — HYDROCODONE-ACETAMINOPHEN 5-325 MG PO TABS
1.0000 | ORAL_TABLET | Freq: Once | ORAL | Status: AC
  Administered 2018-10-13: 1 via ORAL
  Filled 2018-10-13: qty 1

## 2018-10-13 MED ORDER — CLINDAMYCIN HCL 150 MG PO CAPS
300.0000 mg | ORAL_CAPSULE | Freq: Once | ORAL | Status: AC
  Administered 2018-10-13: 300 mg via ORAL
  Filled 2018-10-13: qty 2

## 2018-10-13 NOTE — ED Triage Notes (Signed)
Patient complains dental abscess to left upper jaw. Patient complains of pain fever and facial swelling that goes up to his eye. States his throat feels swollen. States nausea, denies vomiting.

## 2018-10-13 NOTE — ED Provider Notes (Signed)
[facility] EMERGENCY DEPARTMENT Provider Note   CSN: [id number] Arrival date & time: 10/13/18  1242     History   Chief Complaint Chief Complaint  Patient presents with  . Dental Pain    HPI Donn PieriniJoey L Koziel is a 42 y.o. male.  HPI Patient presents with facial pain and swelling. Onset was about 12 hours ago. Since that time he has had pain and swelling on the left maxilla. No difficulty breathing, no objective fever Pain is severe, sore, pressure-like. Patient is here with his wife who assists with the HPI They note that the patient has multiple dental issues, has had dental infections in the past.  Past Medical History:  Diagnosis Date  . Anxiety   . Depression   . Insomnia   . Panic attack     Patient Active Problem List   Diagnosis Date Noted  . Depression 06/03/2014  . Insomnia 06/03/2014    Past Surgical History:  Procedure Laterality Date  . HAND SURGERY Right         Home Medications    Prior to Admission medications   Medication Sig Start Date End Date Taking? Authorizing Provider  clindamycin (CLEOCIN) 150 MG capsule Take 2 capsules (300 mg total) by mouth 3 (three) times daily for 7 days. 10/13/18 10/20/18  Gerhard MunchLockwood, Esmeralda Malay, MD  HYDROcodone-acetaminophen (NORCO/VICODIN) 5-325 MG tablet Take 1 tablet by mouth every 6 (six) hours as needed for severe pain. 10/13/18   Gerhard MunchLockwood, Keeton Kassebaum, MD    Family History No family history on file.  Social History Social History   Tobacco Use  . Smoking status: Current Every Day Smoker    Packs/day: 1.00    Types: Cigarettes  . Smokeless tobacco: Never Used  Substance Use Topics  . Alcohol use: Yes    Comment: occ. use  . Drug use: No    Comment: denies use     Allergies   Cephalexin; Levofloxacin; Penicillins; and Propoxyphene n-acetaminophen   Review of Systems Review of Systems  Constitutional:       Per HPI, otherwise negative  HENT:       Per HPI, otherwise negative  Respiratory:   Per HPI, otherwise negative  Cardiovascular:       Per HPI, otherwise negative  Gastrointestinal: Negative for vomiting.  Endocrine:       Negative aside from HPI  Genitourinary:       Neg aside from HPI   Musculoskeletal:       Per HPI, otherwise negative  Skin: Negative.   Neurological: Negative for syncope.     Physical Exam Updated Vital Signs BP 127/86   Pulse (!) 116   Temp (!) 100.6 F (38.1 C) (Oral)   Resp 20   Ht 5\' 11"  (1.803 m)   Wt 86.2 kg   SpO2 95%   BMI 26.50 kg/m   Physical Exam Vitals signs and nursing note reviewed.  Constitutional:      General: He is not in acute distress.    Appearance: He is well-developed.  HENT:     Head: Normocephalic.     Jaw: No trismus or malocclusion.     Salivary Glands: Right salivary gland is not diffusely enlarged or tender. Left salivary gland is not diffusely enlarged or tender.      Mouth/Throat:     Pharynx: Oropharynx is clear. Uvula midline. No pharyngeal swelling, oropharyngeal exudate, posterior oropharyngeal erythema or uvula swelling.   Eyes:     Conjunctiva/sclera: Conjunctivae normal.  Pulmonary:  Effort: Pulmonary effort is normal. No respiratory distress.     Breath sounds: No stridor.  Musculoskeletal:        General: No deformity.  Skin:    General: Skin is warm and dry.  Neurological:     Mental Status: He is alert and oriented to person, place, and time.  Psychiatric:        Mood and Affect: Mood normal.      ED Treatments / Results   Procedures Procedures (including critical care time)  Medications Ordered in ED Medications  clindamycin (CLEOCIN) capsule 300 mg (300 mg Oral Given 10/13/18 1840)  HYDROcodone-acetaminophen (NORCO/VICODIN) 5-325 MG per tablet 1 tablet (1 tablet Oral Given 10/13/18 1840)     Initial Impression / Assessment and Plan / ED Course  I have reviewed the triage vital signs and the nursing notes.  Pertinent labs & imaging results that were available  during my care of the patient were reviewed by me and considered in my medical decision making (see chart for details).  Generally well-appearing male presents with 1 day of facial pain and swelling Patient has borderline fever, is mildly tachycardic, but is not hypotensive, in any distress, has no respiratory compromise, low suspicion for bacteremia, sepsis. Patient has a pair of teeth both broken to gumline on the left axillary ridge, no obvious abscess. Absent distress, patient is a candidate for initial oral therapy, though he and his wife are aware of return precautions, need for dental follow-up closely.   Final Clinical Impressions(s) / ED Diagnoses   Final diagnoses:  Dental infection    ED Discharge Orders         Ordered    clindamycin (CLEOCIN) 150 MG capsule  3 times daily     10/13/18 1828    HYDROcodone-acetaminophen (NORCO/VICODIN) 5-325 MG tablet  Every 6 hours PRN     10/13/18 1828           Gerhard MunchLockwood, Khayla Koppenhaver, MD 10/13/18 [phone]

## 2018-10-13 NOTE — Discharge Instructions (Signed)
As discussed, is important that you schedule follow-up with a dentist for removal of the two problematic teeth on the left upper side of your mouth. Please take all medication as directed, and obtain Peridex, and over-the-counter rinse that will facilitate healing from your pharmacy.  Return here for concerning changes in your condition.

## 2018-11-18 ENCOUNTER — Other Ambulatory Visit: Payer: Self-pay

## 2018-11-18 ENCOUNTER — Emergency Department (HOSPITAL_COMMUNITY): Payer: Self-pay

## 2018-11-18 ENCOUNTER — Emergency Department (HOSPITAL_COMMUNITY)
Admission: EM | Admit: 2018-11-18 | Discharge: 2018-11-18 | Disposition: A | Discharge status: Home / Self Care | Payer: Self-pay | Attending: Emergency Medicine | Admitting: Emergency Medicine

## 2018-11-18 ENCOUNTER — Encounter (HOSPITAL_COMMUNITY): Payer: Self-pay | Admitting: Radiology

## 2018-11-18 DIAGNOSIS — W01198A Fall on same level from slipping, tripping and stumbling with subsequent striking against other object, initial encounter: Secondary | ICD-10-CM | POA: Insufficient documentation

## 2018-11-18 DIAGNOSIS — Y92812 Truck as the place of occurrence of the external cause: Secondary | ICD-10-CM | POA: Insufficient documentation

## 2018-11-18 DIAGNOSIS — Y999 Unspecified external cause status: Secondary | ICD-10-CM | POA: Insufficient documentation

## 2018-11-18 DIAGNOSIS — S20212A Contusion of left front wall of thorax, initial encounter: Secondary | ICD-10-CM | POA: Insufficient documentation

## 2018-11-18 DIAGNOSIS — F17210 Nicotine dependence, cigarettes, uncomplicated: Secondary | ICD-10-CM | POA: Insufficient documentation

## 2018-11-18 DIAGNOSIS — Y9389 Activity, other specified: Secondary | ICD-10-CM | POA: Insufficient documentation

## 2018-11-18 MED ORDER — METHOCARBAMOL 500 MG PO TABS: 500.0000 mg | ORAL_TABLET | Freq: Two times a day (BID) | ORAL | 0 refills | Status: AC

## 2018-11-18 MED ORDER — HYDROCODONE-ACETAMINOPHEN 5-325 MG PO TABS
2.0000 | ORAL_TABLET | Freq: Once | ORAL | Status: AC
  Administered 2018-11-18: 2 via ORAL
  Filled 2018-11-18: qty 2

## 2018-11-18 MED ORDER — DICLOFENAC SODIUM 1 % TD GEL: 2.0000 g | Freq: Four times a day (QID) | TRANSDERMAL | 0 refills | Status: AC

## 2018-11-18 NOTE — ED Provider Notes (Signed)
[facility] EMERGENCY DEPARTMENT Provider Note   CSN: [id number] Arrival date & time: 11/18/18  1919     History   Chief Complaint Chief Complaint  Patient presents with  . rib pain    HPI [name] is a 43 y.o. male with PMH/o anxiety, depression, insomnia who presents for evaluation of left anterior and lateral chest wall pain that began today after mechanical fall.  Patient reports that he was hanging out of the window of his truck trying to reach the ATM.  He reports that his feet slipped which caused him to fall and land on the windowsill of his truck.  He states that since then, he has had pain to the anterior chest lateral chest wall.  He states when he got home, he felt like he was having more trouble taking deep breath secondary to pain.  Patient states he does not take any medications for pain.  He reports he is not having any issues until after the fall.  Patient denies any abdominal pain, vomiting.  The history is provided by the patient.    Past Medical History:  Diagnosis Date  . Anxiety   . Depression   . Insomnia   . Panic attack     Patient Active Problem List   Diagnosis Date Noted  . Depression 06/03/2014  . Insomnia 06/03/2014    Past Surgical History:  Procedure Laterality Date  . HAND SURGERY Right         Home Medications    Prior to Admission medications   Medication Sig Start Date End Date Taking? Authorizing Provider  diclofenac sodium (VOLTAREN) 1 % GEL Apply 2 g topically 4 (four) times daily. 11/18/18   Volanda Napoleon, PA-C  HYDROcodone-acetaminophen (NORCO/VICODIN) 5-325 MG tablet Take 1 tablet by mouth every 6 (six) hours as needed for severe pain. 10/13/18   Carmin Muskrat, MD  methocarbamol (ROBAXIN) 500 MG tablet Take 1 tablet (500 mg total) by mouth 2 (two) times daily. 11/18/18   Volanda Napoleon, PA-C    Family History No family history on file.  Social History Social History   Tobacco Use  . Smoking status:  Current Every Day Smoker    Packs/day: 1.00    Types: Cigarettes  . Smokeless tobacco: Never Used  Substance Use Topics  . Alcohol use: Yes    Comment: occ. use  . Drug use: No    Comment: denies use     Allergies   Cephalexin; Levofloxacin; Penicillins; and Propoxyphene n-acetaminophen   Review of Systems Review of Systems  Respiratory: Positive for shortness of breath.   Gastrointestinal: Negative for abdominal pain, nausea and vomiting.  Musculoskeletal:       Left lateral chest wall pain  All other systems reviewed and are negative.    Physical Exam Updated Vital Signs BP 132/76 (BP Location: Right Arm)   Pulse 87   Temp 98.9 F (37.2 C) (Oral)   Resp 18   Ht 6' (1.829 m)   Wt 86.2 kg   SpO2 94%   BMI 25.77 kg/m   Physical Exam Vitals signs and nursing note reviewed.  Constitutional:      Appearance: He is well-developed.     Comments: Appears uncomfortable but no acute distress.   HENT:     Head: Normocephalic and atraumatic.  Eyes:     General: No scleral icterus.       Right eye: No discharge.        Left eye:  No discharge.     Conjunctiva/sclera: Conjunctivae normal.  Pulmonary:     Effort: Pulmonary effort is normal.     Breath sounds: Normal breath sounds. No decreased breath sounds.     Comments: Lungs clear to auscultation bilaterally.  Symmetric chest rise.  No wheezing, rales, rhonchi.  No evidence of decreased breath sounds.  Patient able to speak in full sentences without any difficulty. Chest:     Chest wall: Tenderness present. No deformity or crepitus.       Comments: Tenderness palpation noted to the anterior chest wall that extends to the lateral chest wall at approximately ribs 7, 8, 9.  No deformity or crepitus noted.  No evidence of flail chest. Abdominal:     General: Abdomen is flat.     Palpations: Abdomen is soft.     Tenderness: There is no abdominal tenderness. There is no guarding or rebound.     Comments: Abdomen is soft,  non-distended, non-tender. No rigidity, No guarding. No peritoneal signs.  Skin:    General: Skin is warm and dry.  Neurological:     Mental Status: He is alert.  Psychiatric:        Speech: Speech normal.        Behavior: Behavior normal.      ED Treatments / Results  Labs (all labs ordered are listed, but only abnormal results are displayed) Labs Reviewed - No data to display  EKG None  Radiology Dg Chest 2 View  Result Date: 11/18/2018 CLINICAL DATA:  [place] with left-sided chest and rib pain. EXAM: CHEST - 2 VIEW COMPARISON:  None. FINDINGS: The heart size and mediastinal contours are within normal limits. Both lungs are clear. The visualized skeletal structures are unremarkable. No sign of rib fracture or pneumothorax. IMPRESSION: No active cardiopulmonary disease. Electronically Signed   By: Nelson Chimes M.D.   On: 11/18/2018 19:46    Procedures Procedures (including critical care time)  Medications Ordered in ED Medications  HYDROcodone-acetaminophen (NORCO/VICODIN) 5-325 MG per tablet 2 tablet (2 tablets Oral Given 11/18/18 2004)     Initial Impression / Assessment and Plan / ED Course  I have reviewed the triage vital signs and the nursing notes.  Pertinent labs & imaging results that were available during my care of the patient were reviewed by me and considered in my medical decision making (see chart for details).     43 year old male who presents for evaluation of left anterior and lateral chest wall pain that began after mechanical fall earlier today.  He reports that he was in his truck and states that he was reaching out of the window to get to the ATM machine when his foot slipped, causing him to fall and hit his chest wall on the windowsill.  Patient reports he has had pain since then.  When he went home, he was having some difficulty breathing, prompting ED visit.  He has not taken medications for the pain.  Patient states that the pain is worse with movement  and palpation and additionally feels like he cannot take a full deep breath in without pain.  Patient reports that his pain began after mechanical fall. Patient is afebrile, non-toxic appearing, sitting comfortably on examination table. Vital signs reviewed and stable.  Patient is not hypoxic or tachycardic.  On exam, he is tenderness palpation noted to anterior and left-sided chest wall particularly at ribs 7, 8, 9.  No deformity or crepitus noted.  No evidence of flail chest.  Lungs are clear to auscultation bilaterally with no evidence of decreased breath sounds.  Consider rib fracture versus contusion.  History/physical exam not concerning for pneumothorax.  Doubt PE given history/physical exam.  History/physical exam not concerning for ACS etiology.  X-ray reviewed.  Negative for any acute fracture dislocation.  Discussed results with patient.  We will plan to treat as rib contusion.  We will plan to give patient an incentive spirometer for prevention of pneumonia.  Encourage at home supportive care measures. Repeat vitals stable without any signs of hypoxia or tachycardia. At this time, patient exhibits no emergent life-threatening condition that require further evaluation in ED or admission. Patient had ample opportunity for questions and discussion. All patient's questions were answered with full understanding. Strict return precautions discussed. Patient expresses understanding and agreement to plan.   Portions of this note were generated with [employer]. Dictation errors may occur despite best attempts at proofreading.  Final Clinical Impressions(s) / ED Diagnoses   Final diagnoses:  Contusion of rib on left side, initial encounter    ED Discharge Orders         Ordered    methocarbamol (ROBAXIN) 500 MG tablet  2 times daily     11/18/18 2018    diclofenac sodium (VOLTAREN) 1 % GEL  4 times daily     11/18/18 2018           Desma Mcgregor 11/18/18 2047      Francine Graven, DO 11/21/18 1356

## 2018-11-18 NOTE — ED Notes (Signed)
From Rad  LL in to assess

## 2018-11-18 NOTE — ED Triage Notes (Signed)
Raining pt slipped and fell across his truck getting in, hurting left ribs and hurt a pop, SOB

## 2018-11-18 NOTE — ED Notes (Signed)
To Rad 

## 2018-11-18 NOTE — ED Notes (Signed)
Pt left cell phone charger in room   Labeled and given to security

## 2018-11-18 NOTE — ED Notes (Signed)
Slipped getting into truck and hit ribs  Heard a pop  Here for evauation

## 2018-11-18 NOTE — Discharge Instructions (Addendum)
You can take Tylenol or Ibuprofen as directed for pain. You can alternate Tylenol and Ibuprofen every 4 hours. If you take Tylenol at 1pm, then you can take Ibuprofen at 5pm. Then you can take Tylenol again at 9pm.   Take Robaxin as prescribed. This medication will make you drowsy so do not drive or drink alcohol when taking it.  Apply Voltaren gel as directed.  As we discussed, it is important that you are taking big full deep breaths to prevent pneumonia.    Follow-up with your primary care doctor as directed.  Return emergency department for any high fever, difficulty breathing, worsening pain, vomiting or any other worsening or concerning symptoms.

## 2019-11-27 ENCOUNTER — Encounter: Payer: Self-pay | Admitting: Family Medicine

## 2020-05-09 IMAGING — DX DG CHEST 2V
2 series · 2 of 2 positions shown · non-contrast
Comparison: None.

CLINICAL DATA: Fell with left-sided chest and rib pain.

EXAM:
CHEST - 2 VIEW

[chest pa]
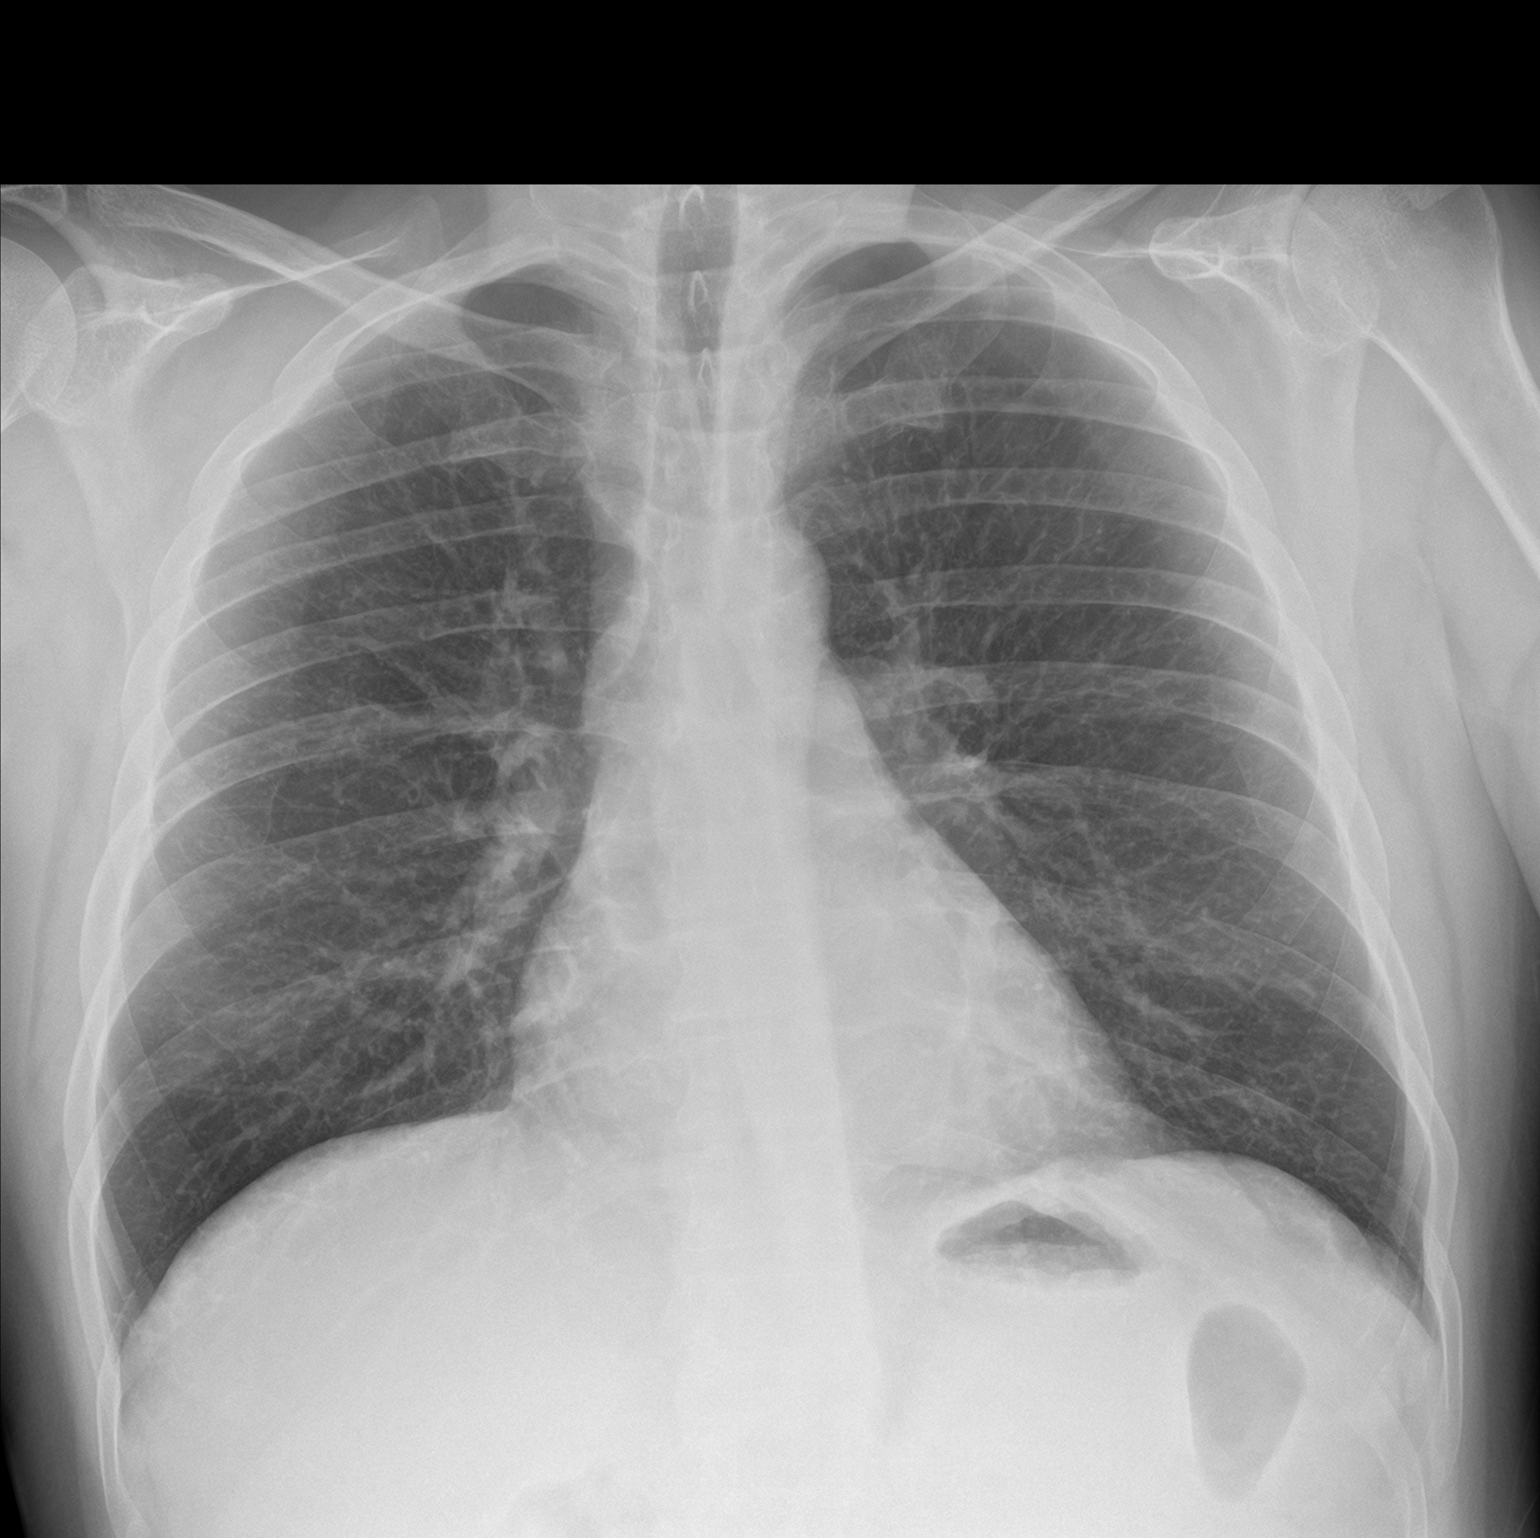

[chest lat]
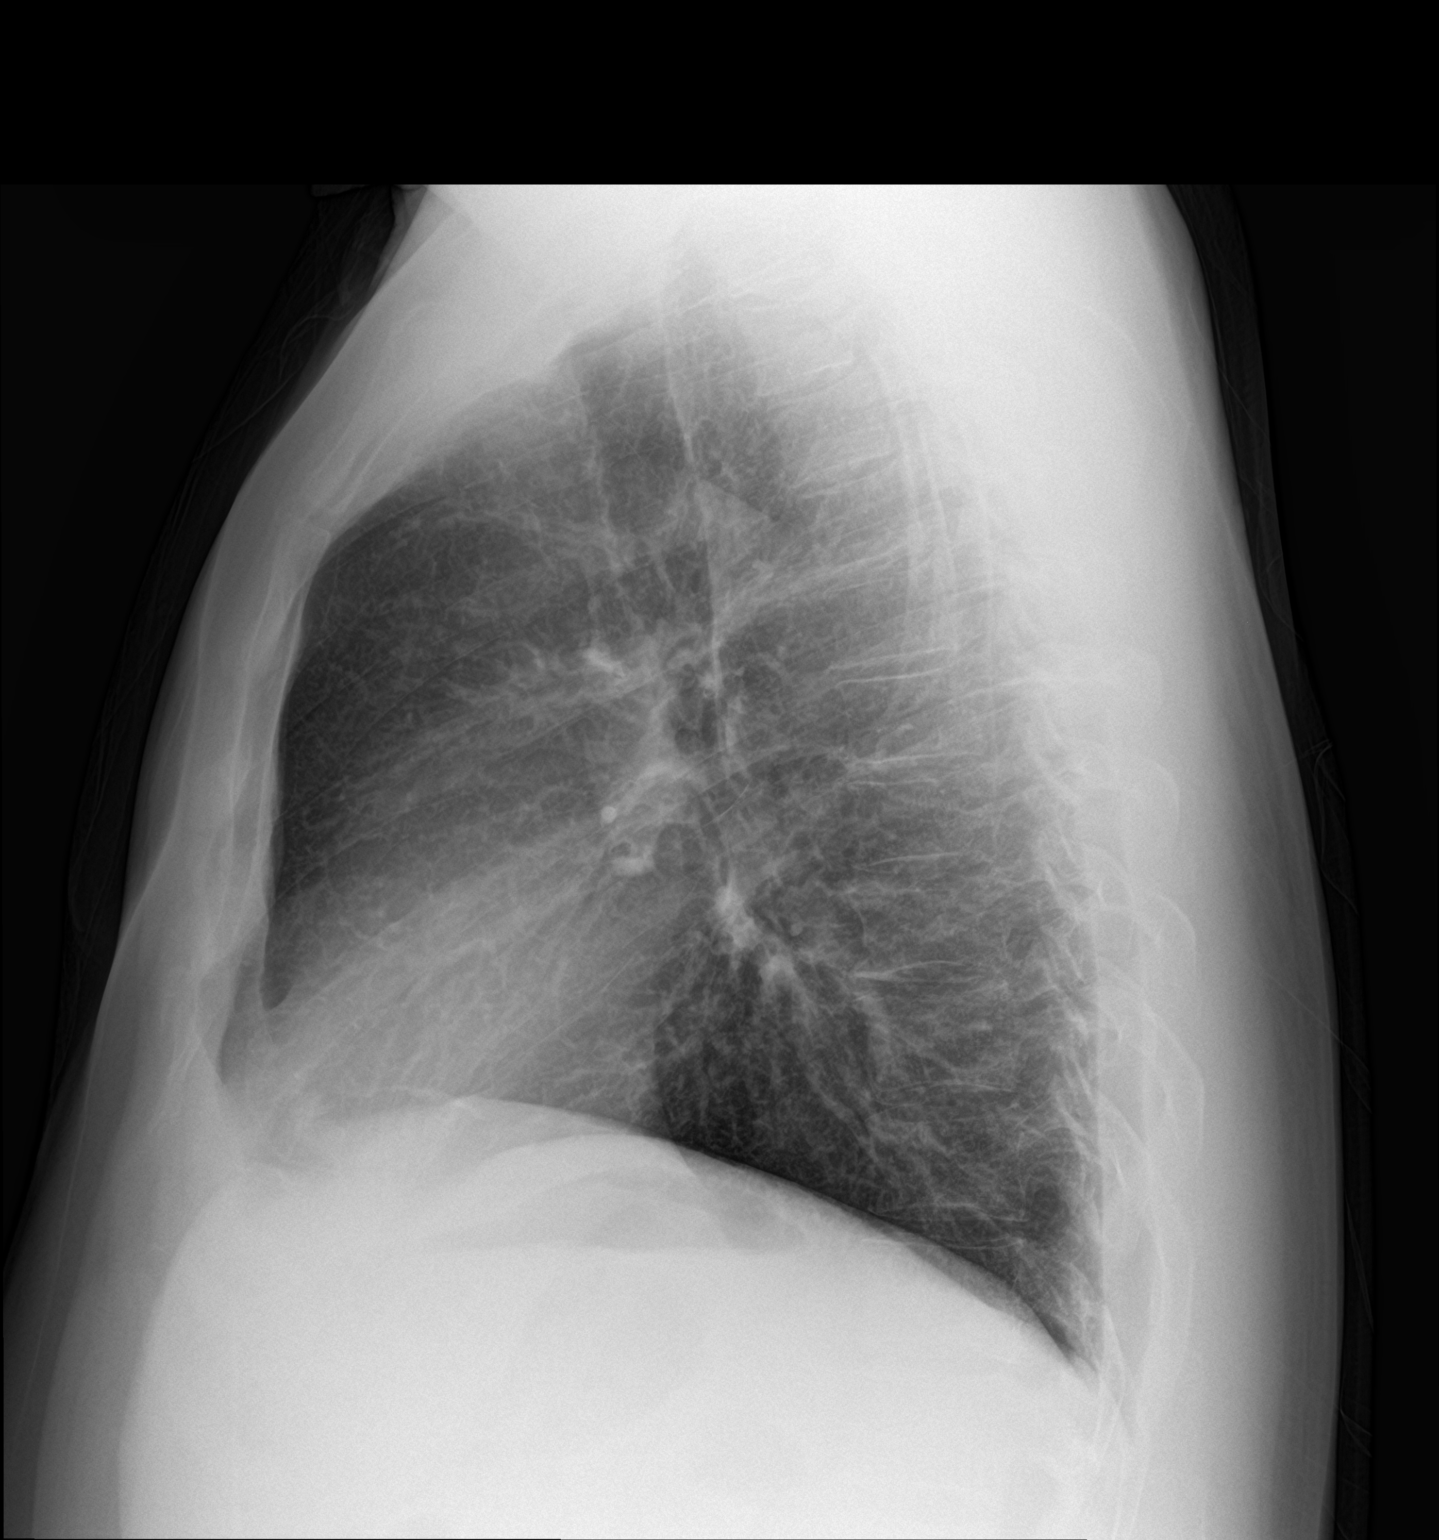

[2 of 2 positions shown; findings below may reference images not displayed]

FINDINGS: The heart size and mediastinal contours are within normal limits.
Both lungs are clear. The visualized skeletal structures are
unremarkable.

No sign of rib fracture or pneumothorax.
IMPRESSION: No active cardiopulmonary disease.

## 2020-07-18 ENCOUNTER — Encounter: Payer: Self-pay | Admitting: Family Medicine

## 2024-06-18 ENCOUNTER — Emergency Department (HOSPITAL_COMMUNITY): Payer: Self-pay

## 2024-06-18 ENCOUNTER — Emergency Department (HOSPITAL_BASED_OUTPATIENT_CLINIC_OR_DEPARTMENT_OTHER): Payer: Self-pay

## 2024-06-18 ENCOUNTER — Encounter (HOSPITAL_BASED_OUTPATIENT_CLINIC_OR_DEPARTMENT_OTHER): Payer: Self-pay

## 2024-06-18 ENCOUNTER — Emergency Department (HOSPITAL_BASED_OUTPATIENT_CLINIC_OR_DEPARTMENT_OTHER): Payer: Self-pay | Admitting: Radiology

## 2024-06-18 ENCOUNTER — Emergency Department (HOSPITAL_BASED_OUTPATIENT_CLINIC_OR_DEPARTMENT_OTHER)
Admission: EM | Admit: 2024-06-18 | Discharge: 2024-06-18 | Disposition: A | Discharge status: Home / Self Care | Payer: Self-pay | Attending: Emergency Medicine | Admitting: Emergency Medicine

## 2024-06-18 ENCOUNTER — Other Ambulatory Visit: Payer: Self-pay

## 2024-06-18 DIAGNOSIS — J439 Emphysema, unspecified: Secondary | ICD-10-CM | POA: Insufficient documentation

## 2024-06-18 DIAGNOSIS — M5432 Sciatica, left side: Secondary | ICD-10-CM

## 2024-06-18 DIAGNOSIS — M5440 Lumbago with sciatica, unspecified side: Secondary | ICD-10-CM

## 2024-06-18 DIAGNOSIS — M5441 Lumbago with sciatica, right side: Secondary | ICD-10-CM | POA: Insufficient documentation

## 2024-06-18 DIAGNOSIS — R930 Abnormal findings on diagnostic imaging of skull and head, not elsewhere classified: Secondary | ICD-10-CM | POA: Insufficient documentation

## 2024-06-18 DIAGNOSIS — M5442 Lumbago with sciatica, left side: Secondary | ICD-10-CM | POA: Insufficient documentation

## 2024-06-18 DIAGNOSIS — Z79899 Other long term (current) drug therapy: Secondary | ICD-10-CM | POA: Insufficient documentation

## 2024-06-18 LAB — CBC WITH DIFFERENTIAL/PLATELET
Abs Immature Granulocytes: 0.04 K/uL (ref 0.00–0.07)
Basophils Absolute: 0.1 K/uL (ref 0.0–0.1)
Basophils Relative: 1 %
Eosinophils Absolute: 0.3 K/uL (ref 0.0–0.5)
Eosinophils Relative: 3 %
HCT: 45.1 % (ref 39.0–52.0)
Hemoglobin: 15.8 g/dL (ref 13.0–17.0)
Immature Granulocytes: 0 %
Lymphocytes Relative: 21 %
Lymphs Abs: 2.4 K/uL (ref 0.7–4.0)
MCH: 30.7 pg (ref 26.0–34.0)
MCHC: 35 g/dL (ref 30.0–36.0)
MCV: 87.6 fL (ref 80.0–100.0)
Monocytes Absolute: 0.7 K/uL (ref 0.1–1.0)
Monocytes Relative: 6 %
Neutro Abs: 8 K/uL — ABNORMAL HIGH (ref 1.7–7.7)
Neutrophils Relative %: 69 %
Platelets: 228 K/uL (ref 150–400)
RBC: 5.15 MIL/uL (ref 4.22–5.81)
RDW: 12.1 % (ref 11.5–15.5)
WBC: 11.5 K/uL — ABNORMAL HIGH (ref 4.0–10.5)
nRBC: 0 % (ref 0.0–0.2)

## 2024-06-18 LAB — URINALYSIS, ROUTINE W REFLEX MICROSCOPIC
Bacteria, UA: NONE SEEN
Bilirubin Urine: NEGATIVE
Glucose, UA: NEGATIVE mg/dL
Ketones, ur: NEGATIVE mg/dL
Leukocytes,Ua: NEGATIVE
Nitrite: NEGATIVE
Protein, ur: NEGATIVE mg/dL
Specific Gravity, Urine: 1.006 (ref 1.005–1.030)
pH: 6.5 (ref 5.0–8.0)

## 2024-06-18 LAB — COMPREHENSIVE METABOLIC PANEL WITH GFR
ALT: 57 U/L — ABNORMAL HIGH (ref 0–44)
AST: 44 U/L — ABNORMAL HIGH (ref 15–41)
Albumin: 4.5 g/dL (ref 3.5–5.0)
Alkaline Phosphatase: 94 U/L (ref 38–126)
Anion gap: 12 (ref 5–15)
BUN: 7 mg/dL (ref 6–20)
CO2: 23 mmol/L (ref 22–32)
Calcium: 10.4 mg/dL — ABNORMAL HIGH (ref 8.9–10.3)
Chloride: 105 mmol/L (ref 98–111)
Creatinine, Ser: 1.05 mg/dL (ref 0.61–1.24)
GFR, Estimated: >60 mL/min (ref >60)
Glucose, Bld: 94 mg/dL (ref 70–99)
Potassium: 3.9 mmol/L (ref 3.5–5.1)
Sodium: 140 mmol/L (ref 135–145)
Total Bilirubin: 0.4 mg/dL (ref 0.0–1.2)
Total Protein: 7.3 g/dL (ref 6.5–8.1)

## 2024-06-18 LAB — TROPONIN T, HIGH SENSITIVITY: Troponin T High Sensitivity: <15 ng/L (ref 0–19)

## 2024-06-18 MED ORDER — METHYLPREDNISOLONE 4 MG PO TBPK: ORAL_TABLET | ORAL | 0 refills | Status: AC

## 2024-06-18 MED ORDER — CYCLOBENZAPRINE HCL 10 MG PO TABS
10.0000 mg | ORAL_TABLET | Freq: Two times a day (BID) | ORAL | 0 refills | Status: AC | PRN
Start: 2024-06-18 — End: ?

## 2024-06-18 MED ORDER — HYDROMORPHONE HCL 1 MG/ML IJ SOLN
1.0000 mg | Freq: Once | INTRAMUSCULAR | Status: AC
  Administered 2024-06-18: 1 mg via INTRAVENOUS
  Filled 2024-06-18: qty 1

## 2024-06-18 MED ORDER — LIDOCAINE 5 % EX PTCH
1.0000 | MEDICATED_PATCH | CUTANEOUS | Status: DC
  Administered 2024-06-18: 1 via TRANSDERMAL
  Filled 2024-06-18: qty 1

## 2024-06-18 MED ORDER — CYCLOBENZAPRINE HCL 10 MG PO TABS
10.0000 mg | ORAL_TABLET | Freq: Once | ORAL | Status: AC
  Administered 2024-06-18: 10 mg via ORAL
  Filled 2024-06-18: qty 1

## 2024-06-18 MED ORDER — GADOBUTROL 1 MMOL/ML IV SOLN
9.0000 mL | Freq: Once | INTRAVENOUS | Status: AC | PRN
  Administered 2024-06-18: 9 mL via INTRAVENOUS

## 2024-06-18 MED ORDER — DIAZEPAM 5 MG PO TABS
5.0000 mg | ORAL_TABLET | Freq: Once | ORAL | Status: AC
  Administered 2024-06-18: 5 mg via ORAL
  Filled 2024-06-18: qty 1

## 2024-06-18 MED ORDER — OXYCODONE-ACETAMINOPHEN 5-325 MG PO TABS: 1.0000 | ORAL_TABLET | ORAL | 0 refills | Status: AC | PRN

## 2024-06-18 MED ORDER — ONDANSETRON HCL 4 MG/2ML IJ SOLN
4.0000 mg | Freq: Once | INTRAMUSCULAR | Status: AC
  Administered 2024-06-18: 4 mg via INTRAVENOUS
  Filled 2024-06-18: qty 2

## 2024-06-18 MED ORDER — LIDOCAINE 5 % EX PTCH: 1.0000 | MEDICATED_PATCH | CUTANEOUS | 0 refills | Status: AC

## 2024-06-18 MED ORDER — OXYCODONE-ACETAMINOPHEN 5-325 MG PO TABS
1.0000 | ORAL_TABLET | Freq: Once | ORAL | Status: AC
  Administered 2024-06-18: 1 via ORAL
  Filled 2024-06-18: qty 1

## 2024-06-18 MED ORDER — IOHEXOL 350 MG/ML SOLN
100.0000 mL | Freq: Once | INTRAVENOUS | Status: AC | PRN
Start: 2024-06-18 — End: 2024-06-18
  Administered 2024-06-18: 100 mL via INTRAVENOUS

## 2024-06-18 NOTE — ED Provider Notes (Signed)
 [name] Provider Note   CSN: [id number] Arrival date & time: 06/18/24  1157     Patient presents with: Back Pain   [name] is a 48 y.o. male.   48 yo M with a chief complaints of low back pain.  The patient states that he was hooking up a trailer to his truck and he bent down to put the pin and suddenly had pain and then he felt like went down both of his legs.  Since then he is feeling like he is urinating on himself he has pain that goes down both legs they feel a bit and weak and he has had trouble ambulating without holding onto something.  He also is complaining of feeling like his ears are ringing.  He says they seem to pulsate.  He denies any obvious chest pain with this.  He says sometimes he feels like he has trouble breathing when doing certain things.   Back Pain      Prior to Admission medications   Medication Sig Start Date End Date Taking? Authorizing Provider  diclofenac  sodium (VOLTAREN ) 1 % GEL Apply 2 g topically 4 (four) times daily. 11/18/18   Layden, Lindsey A, PA-C  HYDROcodone -acetaminophen  (NORCO/VICODIN) 5-325 MG tablet Take 1 tablet by mouth every 6 (six) hours as needed for severe pain. 10/13/18   Garrick Charleston, MD  methocarbamol  (ROBAXIN ) 500 MG tablet Take 1 tablet (500 mg total) by mouth 2 (two) times daily. 11/18/18   Layden, Lindsey A, PA-C    Allergies: Cephalexin, Levofloxacin, Penicillins, and Propoxyphene n-acetaminophen     Review of Systems  Musculoskeletal:  Positive for back pain.    Updated Vital Signs BP (!) 182/107   Pulse 65   Temp 98.6 F (37 C)   Resp 19   SpO2 94%   Physical Exam Vitals and nursing note reviewed.  Constitutional:      Appearance: He is well-developed.  HENT:     Head: Normocephalic and atraumatic.  Eyes:     Pupils: Pupils are equal, round, and reactive to light.  Neck:     Vascular: No JVD.  Cardiovascular:     Rate and Rhythm: Normal rate and  regular rhythm.     Heart sounds: No murmur heard.    No friction rub. No gallop.  Pulmonary:     Effort: No respiratory distress.     Breath sounds: No wheezing.  Abdominal:     General: There is no distension.     Tenderness: There is no abdominal tenderness. There is no guarding or rebound.  Musculoskeletal:        General: Normal range of motion.     Cervical back: Normal range of motion and neck supple.     Comments: Pulse and motor intact to bilateral lower extremities.  Patient tells me he cannot feel in between the webspace of the 1st and 2nd digit on the right foot.  Sensation intact to the left foot.  Reflexes are perhaps mildly diminished bilaterally.  No clonus.  Skin:    Coloration: Skin is not pale.     Findings: No rash.  Neurological:     Mental Status: He is alert and oriented to person, place, and time.  Psychiatric:        Behavior: Behavior normal.     (all labs ordered are listed, but only abnormal results are displayed) Labs Reviewed  CBC WITH DIFFERENTIAL/PLATELET - Abnormal; Notable for the following components:  Result Value   WBC 11.5 (*)    Neutro Abs 8.0 (*)    All other components within normal limits  COMPREHENSIVE METABOLIC PANEL WITH GFR - Abnormal; Notable for the following components:   Calcium 10.4 (*)    AST 44 (*)    ALT 57 (*)    All other components within normal limits  URINALYSIS, ROUTINE W REFLEX MICROSCOPIC - Abnormal; Notable for the following components:   Color, Urine COLORLESS (*)    Hgb urine dipstick MODERATE (*)    All other components within normal limits  TROPONIN T, HIGH SENSITIVITY    EKG: EKG Interpretation Date/Time:  Sunday June 18 2024 14:32:54 EDT Ventricular Rate:  65 PR Interval:  123 QRS Duration:  105 QT Interval:  463 QTC Calculation: 482 R Axis:   -29  Text Interpretation: Sinus rhythm Borderline left axis deviation Borderline prolonged QT interval No old tracing to compare Confirmed by Emil Share [id number]) on 06/18/2024 2:38:10 PM  Radiology: CT L-SPINE NO CHARGE Result Date: 06/18/2024 CLINICAL DATA:  Back pain after pulling trailer. EXAM: CT LUMBAR SPINE WITHOUT CONTRAST TECHNIQUE: Multidetector CT imaging of the lumbar spine was performed without intravenous contrast administration. Multiplanar CT image reconstructions were also generated. RADIATION DOSE REDUCTION: This exam was performed according to the departmental dose-optimization program which includes automated exposure control, adjustment of the mA and/or kV according to patient size and/or use of iterative reconstruction technique. COMPARISON:  CT abdomen pelvis dated 08/29/2009. FINDINGS: Segmentation: 5 lumbar type vertebrae. Alignment: No acute subluxation. Vertebrae: No acute fracture. Paraspinal and other soft tissues: Negative. Disc levels: No acute findings. No significant degenerative changes. IMPRESSION: No acute/traumatic lumbar spine pathology. Electronically Signed   By: Vanetta Chou M.D.   On: 06/18/2024 15:02   CT Angio Chest/Abd/Pel for Dissection W and/or Wo Contrast Result Date: 06/18/2024 CLINICAL DATA:  Back pain. Acute aortic syndrome suspected. Hypertension. EXAM: CT ANGIOGRAPHY CHEST, ABDOMEN AND PELVIS TECHNIQUE: Non-contrast CT of the chest was initially obtained. Multidetector CT imaging through the chest, abdomen and pelvis was performed using the standard protocol during bolus administration of intravenous contrast. Multiplanar reconstructed images and MIPs were obtained and reviewed to evaluate the vascular anatomy. RADIATION DOSE REDUCTION: This exam was performed according to the departmental dose-optimization program which includes automated exposure control, adjustment of the mA and/or kV according to patient size and/or use of iterative reconstruction technique. CONTRAST:  OMNIPAQUE  IOHEXOL  350 MG/ML SOLN COMPARISON:  CT dated 08/29/2009. FINDINGS: CTA CHEST FINDINGS Cardiovascular: There is no  cardiomegaly or pericardial effusion. The thoracic aorta is unremarkable. The origins of the great vessels of the aortic arch are patent. No pulmonary artery embolus identified. Mediastinum/Nodes: No hilar or mediastinal adenopathy. The esophagus and the thyroid gland are grossly unremarkable no mediastinal fluid collection. Lungs/Pleura: Background of mild paraseptal emphysema. There is a 4 mm right middle lobe subpleural nodule. No focal consolidation, pleural effusion, or pneumothorax. The central airways are patent. Musculoskeletal: No acute osseous pathology. Review of the MIP images confirms the above findings. CTA ABDOMEN AND PELVIS FINDINGS VASCULAR Aorta: Normal caliber aorta without aneurysm, dissection, vasculitis or significant stenosis. Celiac: Patent without evidence of aneurysm, dissection, vasculitis or significant stenosis. SMA: Patent without evidence of aneurysm, dissection, vasculitis or significant stenosis. Renals: Both renal arteries are patent without evidence of aneurysm, dissection, vasculitis, fibromuscular dysplasia or significant stenosis. IMA: Patent without evidence of aneurysm, dissection, vasculitis or significant stenosis. Inflow: Patent without evidence of aneurysm, dissection, vasculitis or significant stenosis. Veins:  No obvious venous abnormality within the limitations of this arterial phase study. Review of the MIP images confirms the above findings. NON-VASCULAR Hepatobiliary: Fatty liver. No biliary dilatation. The gallbladder is unremarkable. Pancreas: Unremarkable. No pancreatic ductal dilatation or surrounding inflammatory changes. Spleen: Normal in size without focal abnormality. Adrenals/Urinary Tract: The adrenal glands unremarkable. The kidneys, visualized ureters, and urinary bladder appear unremarkable. Stomach/Bowel: No bowel obstruction or active inflammation. The the appendix is normal. Lymphatic: No adenopathy. Reproductive: The prostate and seminal vesicles are  grossly remarkable Other: None Musculoskeletal: No acute osseous pathology. Review of the MIP images confirms the above findings. IMPRESSION: 1. No acute intrathoracic, abdominal, or pelvic pathology. No aortic aneurysm or dissection. 2. Fatty liver. 3. A 4 mm right middle lobe subpleural nodule. No follow-up needed if patient is low-risk.This recommendation follows the consensus statement: Guidelines for Management of Incidental Pulmonary Nodules Detected on CT Images: From the Fleischner Society 2017; Radiology 2017; 284:228-243. 4.  Emphysema (ICD10-J43.9). Electronically Signed   By: Vanetta Chou M.D.   On: 06/18/2024 14:59     Procedures   Medications Ordered in the ED  HYDROmorphone  (DILAUDID ) injection 1 mg (1 mg Intravenous Given 06/18/24 1301)  diazepam  (VALIUM ) tablet 5 mg (5 mg Oral Given 06/18/24 1251)  ondansetron  (ZOFRAN ) injection 4 mg (4 mg Intravenous Given 06/18/24 1300)  iohexol  (OMNIPAQUE ) 350 MG/ML injection 100 mL (100 mLs Intravenous Contrast Given 06/18/24 1340)                                    Medical Decision Making Amount and/or Complexity of Data Reviewed Labs: ordered. Radiology: ordered.  Risk Prescription drug management.   48 yo M with a chief complaints of sudden onset back pain.  It sounds like the patient has radicular back pain though the patient is describing severe symptoms and has had  difficulty walking, he is also .  He said he would do fine if he had a walker complaining of loss of bladder.  He also has pulsatile ringing in his ears.  It seems unlikely the patient had a sudden aortic dissection but I cannot tie in his ear symptoms with his severe low back pain.  Will obtain a CT dissection study.  If negative and him having some cauda equina symptoms we will likely send for MRI.  CT dissection study is negative.  CT L-spine also negative.  Troponin negative.  No anemia no significant electrolyte abnormalities.  Renal function at baseline.  I  discussed the case with Dr. Tegeler, accepts the patient in ED to ED transfer for MRI.  The patients results and plan were reviewed and discussed.   Any x-rays performed were independently reviewed by myself.   Differential diagnosis were considered with the presenting HPI.  Medications  HYDROmorphone  (DILAUDID ) injection 1 mg (1 mg Intravenous Given 06/18/24 1301)  diazepam  (VALIUM ) tablet 5 mg (5 mg Oral Given 06/18/24 1251)  ondansetron  (ZOFRAN ) injection 4 mg (4 mg Intravenous Given 06/18/24 1300)  iohexol  (OMNIPAQUE ) 350 MG/ML injection 100 mL (100 mLs Intravenous Contrast Given 06/18/24 1340)    Vitals:   06/18/24 1305 06/18/24 1337 06/18/24 1348 06/18/24 1500  BP: (!) 192/97 (!) 195/108 (!) 207/113 (!) 182/107  Pulse: 65 70 80 65  Resp: 17 16 16 19   Temp:      SpO2: 93% 93% 95% 94%    Final diagnoses:  Bilateral sciatica  Final diagnoses:  Bilateral sciatica    ED Discharge Orders     None          Emil Share, DO 06/18/24 1513

## 2024-06-18 NOTE — ED Notes (Signed)
 To MRI at 7p per MRI-

## 2024-06-18 NOTE — Discharge Instructions (Signed)
 Your history, exam, workup today did not show acute cauda equina or a surgical problem in your torso or your low back.  It did show a small disc bulge and your symptoms seem consistent with sciatica and nerve pains called radicular pain.  We feel you are safe for discharge home to use pain medicine, muscle medicine, numbing patches, and a steroid Dosepak to help with your symptoms.  Please follow-up with outpatient neurosurgery for reassessment if symptoms persist and follow-up with your primary doctor as well.  Please rest and stay hydrated.  If any symptoms change or worsen acutely, please return to the nearest emergency department for reassessment.

## 2024-06-18 NOTE — ED Triage Notes (Signed)
 Patient injured his back trying to move a trailer to his truck. He reports it happened 3 hours ago. He states he also has ringing in his ears, tingling in his feet and the extreme lower back pain.

## 2024-06-18 NOTE — ED Notes (Signed)
 Called and canceled [facility] transport @15 :42.  Patient will go POV to [facility] ED.  Spoke with Zachary.

## 2024-06-18 NOTE — ED Provider Notes (Signed)
 Patient transferred from outside facility to get MRIs to rule out cauda equina.  MRIs returned without evidence of cauda equina. there was no evidence of acute spinal abnormality in the thoracic or lumbar spine.  There was mild degenerative disc disease and a small disc bulge at L5-S1.  Will reassess patient and anticipate discharge for outpatient follow-up   Patient agrees with this plan.  Will give prescription for pain medicine, Medrol  Dosepak, muscle relaxant, and Lidoderm  patches.  He will follow-up with a back doctor and PCP.  He agrees with plan of care and patient discharged in good condition.  He was able to stand without difficulty   Clinical Impression: 1. Bilateral sciatica   2. Acute low back pain with sciatica, sciatica laterality unspecified, unspecified back pain laterality     Disposition: Discharge  Condition: Good  I have discussed the results, Dx and Tx plan with the pt(& family if present). He/she/they expressed understanding and agree(s) with the plan. Discharge instructions discussed at great length. Strict return precautions discussed and pt &/or family have verbalized understanding of the instructions. No further questions at time of discharge.    Discharge Medication List as of 06/18/2024  9:55 PM     START taking these medications   Details  cyclobenzaprine  (FLEXERIL ) 10 MG tablet Take 1 tablet (10 mg total) by mouth 2 (two) times daily as needed for muscle spasms., Starting Sun 06/18/2024, Normal    lidocaine  (LIDODERM ) 5 % Place 1 patch onto the skin daily. Remove & Discard patch within 12 hours or as directed by MD, Starting Sun 06/18/2024, Normal    methylPREDNISolone  (MEDROL  DOSEPAK) 4 MG TBPK tablet Please follow directions on Dosepak, Normal    oxyCODONE -acetaminophen  (PERCOCET/ROXICET) 5-325 MG tablet Take 1 tablet by mouth every 4 (four) hours as needed for severe pain (pain score 7-10)., Starting Sun 06/18/2024, Normal        Follow Up: Waddell Simone HERO, DO [address] [place] [address] [phone]     [place], [facility] [address] [place] [address] [phone]        Quantez Schnyder, Lonni PARAS, MD 06/18/24 [phone]
